# Patient Record
Sex: Male | Born: 1955 | Smoking: Former smoker
Health system: Northeastern US, Community
[De-identification: ages and names within clinical notes are randomized; demographics above are authoritative.]

## PROBLEM LIST (undated history)

## (undated) DIAGNOSIS — I639 Cerebral infarction, unspecified: Secondary | ICD-10-CM

## (undated) HISTORY — DX: Cerebral infarction, unspecified: I63.9

---

## 2018-07-18 DIAGNOSIS — I639 Cerebral infarction, unspecified: Secondary | ICD-10-CM

## 2018-07-18 HISTORY — DX: Cerebral infarction, unspecified: I63.9

## 2018-08-20 ENCOUNTER — Encounter (HOSPITAL_BASED_OUTPATIENT_CLINIC_OR_DEPARTMENT_OTHER): Payer: Self-pay | Admitting: Family Medicine

## 2018-08-20 ENCOUNTER — Ambulatory Visit (HOSPITAL_BASED_OUTPATIENT_CLINIC_OR_DEPARTMENT_OTHER): Payer: BC Managed Care – HMO | Admitting: Internal Medicine

## 2018-08-20 ENCOUNTER — Ambulatory Visit: Payer: BC Managed Care – HMO | Attending: Internal Medicine | Admitting: Family Medicine

## 2018-08-20 VITALS — BP 146/84 | HR 76 | Temp 97.7°F | Ht 68.11 in | Wt 189.9 lb

## 2018-08-20 DIAGNOSIS — R3911 Hesitancy of micturition: Secondary | ICD-10-CM | POA: Insufficient documentation

## 2018-08-20 DIAGNOSIS — R030 Elevated blood-pressure reading, without diagnosis of hypertension: Secondary | ICD-10-CM | POA: Insufficient documentation

## 2018-08-20 DIAGNOSIS — Z1211 Encounter for screening for malignant neoplasm of colon: Secondary | ICD-10-CM | POA: Insufficient documentation

## 2018-08-20 DIAGNOSIS — Z1159 Encounter for screening for other viral diseases: Secondary | ICD-10-CM | POA: Insufficient documentation

## 2018-08-20 DIAGNOSIS — Z860100 Personal history of colon polyps, unspecified: Secondary | ICD-10-CM | POA: Insufficient documentation

## 2018-08-20 DIAGNOSIS — Z7189 Other specified counseling: Secondary | ICD-10-CM | POA: Insufficient documentation

## 2018-08-20 DIAGNOSIS — Z8673 Personal history of transient ischemic attack (TIA), and cerebral infarction without residual deficits: Secondary | ICD-10-CM | POA: Insufficient documentation

## 2018-08-20 DIAGNOSIS — Q649 Congenital malformation of urinary system, unspecified: Secondary | ICD-10-CM | POA: Diagnosis present

## 2018-08-20 NOTE — Patient Instructions (Addendum)
Patient Education     DASH Eating Plan  DASH stands for "Dietary Approaches to Stop Hypertension." The DASH eating plan is a healthy eating plan that has been shown to reduce high blood pressure (hypertension). It may also reduce your risk for type 2 diabetes, heart disease, and stroke. The DASH eating plan may also help with weight loss.  What are tips for following this plan?    General guidelines   Avoid eating more than 2,300 mg (milligrams) of salt (sodium) a day. If you have hypertension, you may need to reduce your sodium intake to 1,500 mg a day.   One drink daily max    Work with your health care provider to maintain a healthy body weight or to lose weight. Ask what an ideal weight is for you.   Get at least 30 minutes of exercise that causes your heart to beat faster (aerobic exercise) most days of the week. Activities may include walking, swimming, or biking.   Work with your health care provider or diet and nutrition specialist (dietitian) to adjust your eating plan to your individual calorie needs.  Reading food labels     Check food labels for the amount of sodium per serving. Choose foods with less than 5 percent of the Daily Value of sodium. Generally, foods with less than 300 mg of sodium per serving fit into this eating plan.   To find whole grains, look for the word "whole" as the first word in the ingredient list.  Shopping   Buy products labeled as "low-sodium" or "no salt added."   Buy fresh foods. Avoid canned foods and premade or frozen meals.  Cooking   Avoid adding salt when cooking. Use salt-free seasonings or herbs instead of table salt or sea salt. Check with your health care provider or pharmacist before using salt substitutes.   Do not fry foods. Cook foods using healthy methods such as baking, boiling, grilling, and broiling instead.   Cook with heart-healthy oils, such as olive, canola, soybean, or sunflower oil.  Meal planning   Eat a balanced diet that includes:  ? 5  or more servings of fruits and vegetables each day. At each meal, try to fill half of your plate with fruits and vegetables.  ? Up to 6-8 servings of whole grains each day.  ? Less than 6 oz of lean meat, poultry, or fish each day. A 3-oz serving of meat is about the same size as a deck of cards. One egg equals 1 oz.  ? 2 servings of low-fat dairy each day.  ? A serving of nuts, seeds, or beans 5 times each week.  ? Heart-healthy fats. Healthy fats called Omega-3 fatty acids are found in foods such as flaxseeds and coldwater fish, like sardines, salmon, and mackerel.   Limit how much you eat of the following:  ? Canned or prepackaged foods.  ? Food that is high in trans fat, such as fried foods.  ? Food that is high in saturated fat, such as fatty meat.  ? Sweets, desserts, sugary drinks, and other foods with added sugar.  ? Full-fat dairy products.   Do not salt foods before eating.   Try to eat at least 2 vegetarian meals each week.   Eat more home-cooked food and less restaurant, buffet, and fast food.   When eating at a restaurant, ask that your food be prepared with less salt or no salt, if possible.  What foods are recommended?  The  items listed may not be a complete list. Talk with your dietitian about what dietary choices are best for you.  Grains - MAKE SURE TO CHECK HOW MUCH SODIUM IS IN YOUR BREAD   Whole-grain or whole-wheat bread. Whole-grain or whole-wheat pasta. Brown rice. Modena Morrow. Bulgur. Whole-grain and low-sodium cereals. Pita bread. Low-fat, low-sodium crackers. Whole-wheat flour tortillas.  Vegetables  Fresh or frozen vegetables (raw, steamed, roasted, or grilled). Low-sodium or reduced-sodium tomato and vegetable juice. Low-sodium or reduced-sodium tomato sauce and tomato paste. Low-sodium or reduced-sodium canned vegetables.  Fruits  All fresh, dried, or frozen fruit. Canned fruit in natural juice (without added sugar).  Meat and other protein foods  Skinless chicken or [place].  Ground chicken or [place]. Pork with fat trimmed off. Fish and seafood. Egg whites. Dried beans, peas, or lentils. Unsalted nuts, nut butters, and seeds. Unsalted canned beans. Lean cuts of beef with fat trimmed off. Low-sodium, lean deli meat.  Dairy  Low-fat (1%) or fat-free (skim) milk. Fat-free, low-fat, or reduced-fat cheeses. Nonfat, low-sodium ricotta or cottage cheese. Low-fat or nonfat yogurt. Low-fat, low-sodium cheese.  Fats and oils  Soft margarine without trans fats. Vegetable oil. Low-fat, reduced-fat, or light mayonnaise and salad dressings (reduced-sodium). Canola, safflower, olive, soybean, and sunflower oils. Avocado.  Seasoning and other foods  Herbs. Spices. Seasoning mixes without salt. Unsalted popcorn and pretzels. Fat-free sweets.  What foods are not recommended?  The items listed may not be a complete list. Talk with your dietitian about what dietary choices are best for you.  Grains  Baked goods made with fat, such as croissants, muffins, or some breads. Dry pasta or rice meal packs.  Vegetables  Creamed or fried vegetables. Vegetables in a cheese sauce. Regular canned vegetables (not low-sodium or reduced-sodium). Regular canned tomato sauce and paste (not low-sodium or reduced-sodium). Regular tomato and vegetable juice (not low-sodium or reduced-sodium). Angie Fava. Olives.  Fruits  Canned fruit in a light or heavy syrup. Fried fruit. Fruit in cream or butter sauce.  Meat and other protein foods  Fatty cuts of meat. Ribs. Fried meat. Berniece Salines. Sausage. Bologna and other processed lunch meats. Salami. Fatback. Hotdogs. Bratwurst. Salted nuts and seeds. Canned beans with added salt. Canned or smoked fish. Whole eggs or egg yolks. Chicken or [place] with skin.  Dairy  Whole or 2% milk, cream, and half-and-half. Whole or full-fat cream cheese. Whole-fat or sweetened yogurt. Full-fat cheese. Nondairy creamers. Whipped toppings. Processed cheese and cheese spreads.  Fats and oils  Butter. Stick  margarine. Lard. Shortening. Ghee. Bacon fat. Tropical oils, such as coconut, palm kernel, or palm oil.  Seasoning and other foods  Salted popcorn and pretzels. Onion salt, garlic salt, seasoned salt, table salt, and sea salt. Worcestershire sauce. Tartar sauce. Barbecue sauce. Teriyaki sauce. Soy sauce, including reduced-sodium. Steak sauce. Canned and packaged gravies. Fish sauce. Oyster sauce. Cocktail sauce. Horseradish that you find on the shelf. Ketchup. Mustard. Meat flavorings and tenderizers. Bouillon cubes. Hot sauce and Tabasco sauce. Premade or packaged marinades. Premade or packaged taco seasonings. Relishes. Regular salad dressings.  Where to find more information:   National Heart, Lung, and [place]: https://wilson-eaton.com/   American Heart Association: www.heart.org  Summary   The DASH eating plan is a healthy eating plan that has been shown to reduce high blood pressure (hypertension). It may also reduce your risk for type 2 diabetes, heart disease, and stroke.   With the DASH eating plan, you should limit salt (sodium) intake to 2,300 mg a  day. If you have hypertension, you may need to reduce your sodium intake to 1,500 mg a day.   When on the DASH eating plan, aim to eat more fresh fruits and vegetables, whole grains, lean proteins, low-fat dairy, and heart-healthy fats.   Work with your health care provider or diet and nutrition specialist (dietitian) to adjust your eating plan to your individual calorie needs.  This information is not intended to replace advice given to you by your health care provider. Make sure you discuss any questions you have with your health care provider.  Document Released: 05/29/2011 Document Revised: 06/02/2016 Document Reviewed: 06/02/2016  Elsevier Interactive Patient Education  2019 [employer].

## 2018-08-20 NOTE — Assessment & Plan Note (Signed)
F/u Dr. Caryn Section at [facility]

## 2018-08-20 NOTE — Assessment & Plan Note (Addendum)
F/u at next appt in 2 weeks  BMP drawn in case need to start meds

## 2018-08-20 NOTE — Assessment & Plan Note (Signed)
F/u colonoscopy

## 2018-08-20 NOTE — Progress Notes (Signed)
PRIMARY CARE NOTE - NEW PATIENT       SUBJECTIVE:  Roy Landry is a 63 year old English-speaking male who is here to establish primary care. Prior PCP retired 17yrs ago .     Presents with the following chief complaint:   Establish Care and Referral (needs to have a eeg)    Stroke a while ago which is why needs EEG done     (obtained by [facility] and reviewed)    Problem List        Cardiac and Vasculature    Elevated blood pressure reading     08/20/2018: 146/84 on recheck, concern given recent hx of stroke, pt does not want medications, gave information on the DASH diet and exercise for now            Gastrointestinal and Abdominal    History of colonic polyps     08/20/2018: pt reports were benign, had been getting biannual colonoscopies, none for past 5 years, order placed for screening colonoscopy            Neuro    History of stroke - Primary     08/20/2018: Jan 2020, started aspirin and statin, was told needed f/u EEG, stopped smoking in 2018, referral placed to Dr. Caryn Section at [facility] per pt request              Month ago felt sx of stroke,     Denies: Fevers, chills, nausea, vomiting, diarrhea, chest pain, shortness of breath, skin changes, dizziness, eye redness, edema  All other systems were reviewed and are negative.      Medication list reviewed and updated in EpicCare.     Past Medical History:   has a past medical history of Stroke (cerebrum) ([place]) (07/18/2018).    No past surgical history on file.  Review of patient's family history indicates:  Problem: Diabetes      Relation: Mother          Age of Onset: (Not Specified)  Problem: Diabetes      Relation: Sister          Age of Onset: (Not Specified)    Social History    Tobacco Use      Smoking status: Former Smoker      Smokeless tobacco: Never Used      Tobacco comment: quit in 2018    Alcohol use: Yes      Comment: social drinker, >5 drinks 1-2x years, drinks one beer daily     Drug use: Never    Social History    Social History Narrative       08/20/2018: from the [place] originally, [place], two kids, works in [employer], likes to exercise stationary bike/weights, motivated not to take medications       Review of Patient's Allergies indicates:  No Known Allergies      OBJECTIVE:  BP (!) 150/82  Pulse 76  Temp 97.7 F (36.5 C) (Temporal)  Ht 5' 8.11" (1.73 m)  Wt 86.1 kg (189 lb 14.4 oz)  SpO2 97%  BMI 28.78 kg/m2  Physical Exam   Constitutional: He appears well-developed and well-nourished. No distress.   HENT:   Head: Normocephalic and atraumatic.   Eyes: Conjunctivae are normal.   Pulmonary/Chest: Effort normal. No stridor.   Musculoskeletal: Normal range of motion.   Neurological: Coordination normal.   Skin: Skin is warm and dry. He is not diaphoretic.   Psychiatric: He has  a normal mood and affect.         No relevant labs, imaging, and studies in EpicCare. Pt brought paperwork from [facility] stating needed EEG but no relavent results       ASSESSMENT AND PLAN:  Roy Landry is a 63 year old male who presents with:     Problem List Items Addressed This Visit        Cardiac and Vasculature    Elevated blood pressure reading     F/u at next appt in 2 weeks  BMP drawn in case need to start meds             Gastrointestinal and Abdominal    History of colonic polyps     F/u colonoscopy            Neuro    History of stroke - Primary     F/u Dr. Caryn Section at [facility] (EXT) (Completed)    LIPID PANEL    HEMOGLOBIN O1H    BASIC METABOLIC PANEL      Other Visit Diagnoses     Urinary anomaly        Relevant Orders    PROSTATIC SPECIFIC ANTIGEN    Need for hepatitis C screening test        Relevant Orders    HEPATITIS C PCR QUAL TO QUANT    Advance care planning        Relevant Orders    HEALTH CARE PROXY (Completed)    Screening for malignant neoplasm of colon        Relevant Orders    COLONOSCOPY          **Will obtain medical records from prior PCP office for relevant past medical  history and immunizations.    **Reviewed services provided at this clinic and [facility], including clinic care team, specialty referrals, and after hours access.     Follow up in approximately 2-4weeks     ____________    Demetrius Revel, MD   Pager: [phone]  Office phone: [phone]  Email: [email or web address]  [facility]

## 2018-08-20 NOTE — Assessment & Plan Note (Signed)
F/u PSA and IPSS at f/u appt

## 2018-08-23 ENCOUNTER — Telehealth (HOSPITAL_BASED_OUTPATIENT_CLINIC_OR_DEPARTMENT_OTHER): Payer: Self-pay

## 2018-08-23 ENCOUNTER — Encounter (HOSPITAL_BASED_OUTPATIENT_CLINIC_OR_DEPARTMENT_OTHER): Payer: Self-pay | Admitting: Physician Assistant

## 2018-08-23 DIAGNOSIS — R7303 Prediabetes: Secondary | ICD-10-CM | POA: Insufficient documentation

## 2018-08-23 LAB — BASIC METABOLIC PANEL
ANION GAP: 4 mmol/L — ABNORMAL LOW (ref 5–15)
BUN (UREA NITROGEN): 14 mg/dL (ref 7–18)
CALCIUM: 9.3 mg/dL (ref 8.5–10.1)
CARBON DIOXIDE: 29 mmol/L (ref 21–32)
CHLORIDE: 110 mmol/L — ABNORMAL HIGH (ref 98–107)
CREATININE: 0.8 mg/dL (ref 0.7–1.2)
ESTIMATED GLOMERULAR FILT RATE: >60 mL/min (ref >60)
Glucose Random: 114 mg/dL (ref 74–160)
POTASSIUM: 4.3 mmol/L (ref 3.5–5.1)
SODIUM: 143 mmol/L (ref 136–145)

## 2018-08-23 LAB — LIPID PANEL
Cholesterol: 130 mg/dL (ref 0–239)
HIGH DENSITY LIPOPROTEIN: 45 mg/dL (ref 40–?)
LOW DENSITY LIPOPROTEIN DIRECT: 71 mg/dL (ref 0–189)
TRIGLYCERIDES: 78 mg/dL (ref 0–150)

## 2018-08-23 LAB — HEPATITIS C PCR QUAL TO QUANT: HEPATITIS C ANTIBODY: NONREACTIVE

## 2018-08-23 LAB — HEMOGLOBIN A1C
ESTIMATED AVERAGE GLUCOSE: 117 (ref 74–160)
HEMOGLOBIN A1C: 5.7 % — ABNORMAL HIGH (ref 4.0–5.6)

## 2018-08-23 LAB — PROSTATIC SPECIFIC ANTIGEN: PROSTATIC SPECIFIC ANTIGEN: 0.737 ng/mL (ref 0.000–4.000)

## 2018-08-23 NOTE — Progress Notes (Signed)
LVM for pt to return call

## 2018-08-23 NOTE — Progress Notes (Signed)
Joaquin Bend, PA-C  P Cfh/Cfn Rn Triage            Dear RN,     Please:    1. Create Telephone encounter for this patient.   2. Share with the patient the attached results. Patient pre-DM (but barely), please discuss healthy eating/exercise. All other labs normal.     Plan:   1. No further tests need to be done. .     2. Type of Outreach: 1 phone call and if unable to reach send letter     3. Document the conversation in the Telephone Encounter and close the encounter, no need to send back to me.     Thank you,   Joaquin Bend, PA-C

## 2018-08-23 NOTE — Progress Notes (Signed)
Dear RN,    Please:    1. Create Telephone encounter for this patient.  2. Share with the patient the attached results. Patient pre-DM (but barely), please discuss healthy eating/exercise. All other labs normal.    Plan:  1. No further tests need to be done. .    2. Type of Outreach: 1 phone call and if unable to reach send letter    3. Document the conversation in the Telephone Encounter and close the encounter, no need to send back to me.     Thank you,  Joaquin Bend, PA-C

## 2018-08-24 NOTE — Progress Notes (Signed)
LVM for pt to return call

## 2018-08-25 ENCOUNTER — Encounter (HOSPITAL_BASED_OUTPATIENT_CLINIC_OR_DEPARTMENT_OTHER): Payer: Self-pay

## 2018-08-25 ENCOUNTER — Other Ambulatory Visit (HOSPITAL_BASED_OUTPATIENT_CLINIC_OR_DEPARTMENT_OTHER): Payer: Self-pay

## 2018-08-25 MED ORDER — PEG 3350-KCL-NA BICARB-NACL 420 G PO SOLR
4000.00 mL | Freq: Once | ORAL | 0 refills | Status: AC
Start: 2018-08-25 — End: 2018-08-25

## 2018-08-25 MED ORDER — PEG 3350-KCL-NA BICARB-NACL 420 G PO SOLR: 4000 mL | mL | Freq: Once | ORAL | 0 refills | 0 days | Status: AC

## 2018-08-25 NOTE — Progress Notes (Signed)
Spoke with patient regarding scheduling screening colonoscopy . Procedure/preparation/need for ride explained. Assessment completed. Patient scheduled on 09/27/18 arrive [facility] at 630am with Dr Mamie Levers with MAC for daily etoh  Colonoscopy Standard       Division of Gastroenterology  [facility]  Phone:__________________  [facility]  [place], [place] [address]  PLEASE READ CAREFULLY OR HAVE SOMEONE READ THIS TO YOU!      ____________________________________________  Name    Your Colonoscopy test is scheduled at _____________ Hospital    Day_______________     Time to Arrive _______________    Register ______________________            Dennis Bast are having a colonoscopy.  This test lets the doctor see the inside your large intestine (also called your colon) using a small camera on a flexible tube.  There are several reasons to have the test:   *Screening for colon cancers and polyps    *To check unexplained changes in bowel habits   *To evaluate abnormal X-ray findings   *To look for bleeding ulcers or other abnormalities of the colon lining                 HOW TO GET READY FOR THE TEST      Your prescription was sent to your pharmacy or given to you.    Please pick up your prescription within 1 week of receiving these instructions.     IF YOU ARE USING NULYTELY OR A DRUG LIKE IT, DO NOT ADD WATER TO THE PRESCRIPTION UNTIL 1 DAY BEFORE THE DAY OF YOUR EXAM.    Your prescription was sent to: _______________________________________    Call a friend or a family member to make sure you have   someone to take you home after your test.   You must have someone to go home with after the test or we will not be able to give you sedatives!      If you have any questions or worries, or if you are unsure about how to prepare for this test, please call _____________                                  DAY OF THE TEST    4 to 5 hours before your test, drink the remaining half of the bottle of laxative.    It is  very important to finish the WHOLE [place].    The morning of the test, you can take your other medications at the usual time with a sip of water. These include blood pressure pills, seizure medications, heart medications, thyroid medications, etc).     Don't take pills for diabetes. Bring these medicines with you so that you can take them right after your test.      NOTHING to drink for 3 hours before the test.      IMPORTANT INFORMATION About Your Medicines  Based On Recommendations From Experts    IF YOU HAVE ANY CONCERNS ABOUT YOUR MEDICATIONS [place]. DO NOT WAIT UNTIL THE DAY BEFORE THE TEST!!!!!      Your Medications  You can take your medications for high blood pressure, heart problems, or anxiety with a sip of water at your usual time.  Bring a list of your medications with you.     If you take medicines for Type 2 Diabetes:    One day before  the test: If you are taking diabetes pills: Take PILLS in the morning only. If you take morning insulin: take your usual dose.    On the night before the test: Do not take diabetes pills.  If you take insulin for type 2 diabetes, take  your usual long acting insulin.  For example: if you usually take 40 units of Lantus or NPH, take 20 units instead.    On the morning of the test: Do not take diabetes pills. If you are on long-acting insulin for type 2 diabetes, you can take half the dose. For example if you are on 40 units of Lantus or NPH, take 20 units instead.     After the test: You will be allowed to eat normally again. At that time, you should resume taking your diabetes pills at your usual times. If on insulin, take your usual evening dose of insulin. If you can't eat for any reason, check with your doctor.    If you have Type 1 Diabetes:    One day before the test: Take your usual long-acting basal insulin (Lantus or NPH) and make sure your clear liquid diets contain sugar. Check your blood sugars at meal times and cover with short acting insulin  only if blood sugar is over 200. Otherwise do not take your short-acting insulin.    On the night before the test: Just take your usual basal insulin dose that you would normally take at that time (for example, your usual full dose of Lantus or NPH).    On the morning of the test: Take your usual basal insulin dose that you would normally take at that time (for example, your usual full dose of Lantus or NPH).                            Test Checklist    Please use this list to prepare for your test.     7 days before the test (____/____/____)     STOP taking Motrin, Advil, Naprosyn, Aleve or related drugs.   Continue to take all other prescribed meds.   Ask your doctor about whether you should continue Aspirin. It is sometimes        important to stay on this medication.    3 days before the test (____/____/____)     STOP eating any foods that contain beans, seeds, skins, nuts, or are high in fiber.    2 days before the test (____/____/____)     MAKE SURE YOU HAVE A RIDE ARRANGED    EAT dinner no later than 7 pm.  BEGIN a liquid diet. (Do not eat solids. This includes foods such as rice, pasta, bread and fruit).    1 day before the test (____/____/____)     PREPARE the laxative.   START taking the laxative at 6:00pm.   DRINK  the bottle of the laxative.    Day of your test (____/____/____)     FINISH  the rest of the laxative 5 to 6 hours before your test.     DO NOT drink anything 3 hours before the test.      WHAT TO EXPECT ON THE DAY OF YOUR TEST    Colonoscopy  Colonoscopy is a procedure used to see inside the colon and rectum.  During a colonoscopy a flexible tube with a camera and a light is inserted through the rectum. The doctor then examines the large  bowel (also called the large intestine or colon).    Colonoscopies can detect inflamed tissue, ulcers and abnormal growths called polyps. Some polyps are cancerous, but most are "pre cancerous". These might become dangerous someday. A doctor can usually  remove these polyps during the test. They are then sent to a laboratory to be checked. Polyps are common in adults and are generally harmless. However, most colorectal cancers begin as polyps. Removing them is a good way to prevent cancer. Your doctor may also take biopsies (small pieces of tissue) for analysis in the lab of abnormalities that they want to check in more detail.    It is extremely important to follow all of the steps for cleaning out your colon. If you do not follow all the steps, the doctor may not be able to see clearly. The exam might need to be cancelled and repeated another day. The clear liquids you can take during the clean out are treated as food by your body, so you won't starve or get dehydrated.        Getting Ready At [facility]  On the day of your test, a nurse and doctor will ask you some more information about your health history.  You will be put into a hospital gown. A small intravenous needle will be inserted in the back of your hand or forearm to give you medications that will make you comfortable during the test.    In the procedure room, you will be asked to lie down in a curled position on your left side.  Please inform the doctor if this is uncomfortable for you. You will be given oxygen to breathe. The test usually lasts 30 minutes to an hour. It may last longer if polyps need to be removed or if other abnormalities are noted.      You will be given medication through the IV in order to control discomfort and help you relax.  You may sleep or be partially awake during the test. Sometimes, you might feel a cramping sensation. We will monitor you and try to make you as comfortable as possible. The tube will be inserted into your rectum (back side) and advanced through the large bowel.  The doctor will try and look at all of the inner walls of your colon. The outer wall and organs outside the colon are not visible with this test.    Possible Complications  Complications are  unusual during or after the test but they can happen. The most common risks include colonic perforation (a tear in the colon), bleeding, respiratory problems, blood pressure problems, heart problems, discomfort and adverse reactions to the medications used.  A perforation may result in the need for emergency surgery and a colostomy bag. Also note, colonoscopy like other medical tests is not perfect. It may not detect problems such as polyps, cancers and other diseases up to 2 to 6% of the time. Luckily, the combined risk of all of these problems is small.    After the Test  You may feel bloated from air which was put into your colon during the test. You may also feel a little drowsy from the medications. You cannot drive or operate heavy machinery or do any important work for the rest of the day. You should plan on resting, watching TV or reading light material after the test. You may forget things that happen during and directly after the test. It is important have someone with you that can remind you  of any instructions we give.    Depending on what is found, you may need to have the colonoscopy repeated. Usually, this is done several years later but it may need to be done much sooner. Talk to your doctor about when you should have repeat study or other testing.    You will usually be at the hospital between 2-3 hours (although sometimes it can take longer).  We will make sure that you are alright before sending you home. You must arrange for someone to drive you home after the test.  Once again, we will not perform the test unless you have an arranged ride. You cannot go home in a taxi or a bus.    Colonoscopy is a safe and effective test that is commonly done at our facilities. You may receive a call to remind you of the date and time of your test. If you have any questions, please feel free to call.     For questions about the test itself, call [phone].    For questions about the date and time of your  test, or to change the date or time, call [phone]    For questions regarding your regular medications or health issues, please call your doctor.

## 2018-08-25 NOTE — Progress Notes (Signed)
LVM for pt. to return call x3, letter sent.

## 2018-09-09 ENCOUNTER — Other Ambulatory Visit (HOSPITAL_BASED_OUTPATIENT_CLINIC_OR_DEPARTMENT_OTHER): Payer: Self-pay | Admitting: Internal Medicine

## 2018-09-09 NOTE — Progress Notes (Signed)
PER Patient (self),@ is a 63 year old male has requested a refill of Atorvastatin.      - Please review, Atorvastatin has been discontinued/marked Historical on 08/20/2018.        Last Office Visit: 08/20/2018 with Rande Brunt  Last Physical Exam: NA       Statin Med:  Lipids   Cholesterol (mg/dL)   Date Value   08/20/2018 130     LOW DENSITY LIPOPROTEIN DIRECT (mg/dL)   Date Value   08/20/2018 71     HIGH DENSITY LIPOPROTEIN (mg/dL)   Date Value   08/20/2018 45     TRIGLYCERIDES (mg/dL)   Date Value   08/20/2018 78   LFTs   No results found for: ALT    No results found for: AST    No results found for: ALBUMIN    No results found for: TP    No results found for: DBILI    No results found for: TBILI    No results found for: [facility]       Documented patient preferred pharmacies:    [id number] [place], [place] - [place]  Phone: [phone] Fax: [phone]

## 2018-09-10 MED ORDER — ATORVASTATIN CALCIUM 80 MG PO TABS
80.0000 mg | ORAL_TABLET | Freq: Every day | ORAL | 3 refills | Status: DC
Start: 2018-09-10 — End: 2018-09-14

## 2018-09-10 MED ORDER — ATORVASTATIN CALCIUM 80 MG PO TABS: 80 mg | tablet | Freq: Every day | ORAL | 3 refills | 0 days | Status: AC

## 2018-09-14 ENCOUNTER — Ambulatory Visit: Payer: BC Managed Care – HMO | Attending: Internal Medicine | Admitting: Family Medicine

## 2018-09-14 ENCOUNTER — Other Ambulatory Visit: Payer: Self-pay

## 2018-09-14 DIAGNOSIS — Z8673 Personal history of transient ischemic attack (TIA), and cerebral infarction without residual deficits: Secondary | ICD-10-CM | POA: Insufficient documentation

## 2018-09-14 DIAGNOSIS — R7303 Prediabetes: Secondary | ICD-10-CM | POA: Diagnosis not present

## 2018-09-14 MED ORDER — ASPIRIN LOW DOSE 81 MG PO TBEC: 81 mg | tablet | Freq: Every day | ORAL | 3 refills | 0 days | Status: AC

## 2018-09-14 MED ORDER — ASPIRIN LOW DOSE 81 MG PO TBEC
81.0000 mg | DELAYED_RELEASE_TABLET | Freq: Every day | ORAL | 3 refills | Status: AC
Start: 2018-09-14 — End: 2019-09-14

## 2018-09-14 MED ORDER — ATORVASTATIN CALCIUM 80 MG PO TABS: 80 mg | tablet | Freq: Every day | ORAL | 3 refills | 0 days | Status: AC

## 2018-09-14 MED ORDER — ATORVASTATIN CALCIUM 80 MG PO TABS
80.0000 mg | ORAL_TABLET | Freq: Every day | ORAL | 3 refills | Status: DC
Start: 2018-09-14 — End: 2019-10-07

## 2018-09-14 NOTE — Progress Notes (Signed)
Roy Landry is a 63 year old male here for f/u stroke and pre-diabetes     Problem List        Endocrine and Metabolic    Prediabetes     09/14/2018: advised exercise and continued dietary changes for pre-diabetes, pt agreeable and has made many changes already with his diet   08/23/2018: Hgb A1c 5.7%. RN will call to discuss healthy eating/exercise. Recheck in 1-2 years.            Neuro    History of stroke     09/14/2018: has neurology appoitment on 4/24 at [facility]   08/20/2018: Jan 2020, started aspirin and statin, was told needed f/u EEG, stopped smoking in 2018, referral placed to Dr. Caryn Section at [facility] per pt request                Patient Active Problem List:     History of stroke     Elevated blood pressure reading     History of colonic polyps     Urinary hesitancy     Prediabetes      Current Outpatient Medications:  atorvastatin (LIPITOR) 80 MG tablet Take 1 tablet by mouth daily Disp: 90 tablet Rfl: 3   ASPIRIN LOW DOSE 81 MG EC tablet  Disp:  Rfl:      No current facility-administered medications for this visit.   Review of Patient's Allergies indicates:  No Known Allergies    I have reviewed the past medical, social, and family history. All pertinent historical factors are mentioned in the HPI. The remainder are non-contributory.     Objective:     Pt breathing comfortably and speaking in full sentences without issue   Affect wnl, thought content normal, speech non-pressured       ASSESSMENT&PLAN:    Problem List Items Addressed This Visit        Endocrine and Metabolic    Prediabetes     Recheck A1c in 1 yr             Neuro    History of stroke     Continue statin and aspirin until seen by neuro                We discussed new and current medications and the importance of medication compliance. The patient was ready to learn and no apparent learning barriers were identified. I explained the diagnosis and treatment plan, and the patient expressed understanding of the content. Possible side  effects of the prescribed medication(s) were explained.  I attempted to answer any questions regarding the diagnosis and the proposed treatment.        This patient was identified as meeting criteria for a televisit rather than an in person visit due to public health concerns around COVID-19. A complete assessment and plan is detailed in the note, all of which were conducted remotely using telephone technology.  Patient identity was verbally confirmed by the patient/guardian with 2 identifiers (name, date of birth, and/or address) at the beginning of the visit  Patient/guardian verbally consented to care by televisit as appropriate.   Patient/guardian was located home during the visit and confirmed that they understood they were encouraged to be in private location due to personal health information being discussed.  Patient/guardian was informed how to access face-to-face care in the event of an emergency.  Provider was located in a [facility] Ambulatory exam room during the visit.  If this is a new patient visit, all  available records and medical history were reviewed by the provider.  Visit length was 15 minutes and counseling was done on the diagnoses indicated in the visit.        Signed by   Demetrius Revel, MD   Pager: [phone]  Office phone: [phone]  Email: [email or web address]  [facility]

## 2018-09-14 NOTE — Assessment & Plan Note (Signed)
Continue statin and aspirin until seen by neuro

## 2018-09-14 NOTE — Assessment & Plan Note (Signed)
Recheck A1c in 1 yr

## 2018-09-27 ENCOUNTER — Other Ambulatory Visit (HOSPITAL_BASED_OUTPATIENT_CLINIC_OR_DEPARTMENT_OTHER): Payer: Self-pay

## 2018-10-11 DIAGNOSIS — I517 Cardiomegaly: Secondary | ICD-10-CM | POA: Diagnosis not present

## 2018-10-11 DIAGNOSIS — I10 Essential (primary) hypertension: Secondary | ICD-10-CM | POA: Diagnosis not present

## 2018-10-11 DIAGNOSIS — R209 Unspecified disturbances of skin sensation: Secondary | ICD-10-CM | POA: Diagnosis not present

## 2018-10-11 DIAGNOSIS — E785 Hyperlipidemia, unspecified: Secondary | ICD-10-CM | POA: Diagnosis not present

## 2018-10-11 DIAGNOSIS — I6330 Cerebral infarction due to thrombosis of unspecified cerebral artery: Secondary | ICD-10-CM | POA: Diagnosis not present

## 2018-10-20 DIAGNOSIS — I517 Cardiomegaly: Secondary | ICD-10-CM | POA: Diagnosis not present

## 2018-10-20 DIAGNOSIS — E785 Hyperlipidemia, unspecified: Secondary | ICD-10-CM | POA: Diagnosis not present

## 2018-10-20 DIAGNOSIS — I6330 Cerebral infarction due to thrombosis of unspecified cerebral artery: Secondary | ICD-10-CM | POA: Diagnosis not present

## 2018-10-21 DIAGNOSIS — I6330 Cerebral infarction due to thrombosis of unspecified cerebral artery: Secondary | ICD-10-CM | POA: Diagnosis not present

## 2018-10-21 DIAGNOSIS — I10 Essential (primary) hypertension: Secondary | ICD-10-CM | POA: Diagnosis not present

## 2018-10-21 DIAGNOSIS — I480 Paroxysmal atrial fibrillation: Secondary | ICD-10-CM | POA: Diagnosis not present

## 2018-10-21 DIAGNOSIS — I493 Ventricular premature depolarization: Secondary | ICD-10-CM | POA: Diagnosis not present

## 2018-11-30 DIAGNOSIS — R400 Somnolence: Secondary | ICD-10-CM | POA: Diagnosis not present

## 2018-11-30 DIAGNOSIS — I6350 Cerebral infarction due to unspecified occlusion or stenosis of unspecified cerebral artery: Secondary | ICD-10-CM | POA: Diagnosis not present

## 2018-11-30 DIAGNOSIS — G40209 Localization-related (focal) (partial) symptomatic epilepsy and epileptic syndromes with complex partial seizures, not intractable, without status epilepticus: Secondary | ICD-10-CM | POA: Diagnosis not present

## 2018-12-03 ENCOUNTER — Telehealth (HOSPITAL_BASED_OUTPATIENT_CLINIC_OR_DEPARTMENT_OTHER): Payer: BC Managed Care – HMO

## 2018-12-03 ENCOUNTER — Encounter (HOSPITAL_BASED_OUTPATIENT_CLINIC_OR_DEPARTMENT_OTHER): Payer: Self-pay

## 2018-12-03 NOTE — Progress Notes (Signed)
Left vm

## 2019-04-13 ENCOUNTER — Other Ambulatory Visit (HOSPITAL_BASED_OUTPATIENT_CLINIC_OR_DEPARTMENT_OTHER): Payer: Self-pay | Admitting: Family Medicine

## 2019-04-13 DIAGNOSIS — I10 Essential (primary) hypertension: Secondary | ICD-10-CM | POA: Diagnosis not present

## 2019-04-13 DIAGNOSIS — E785 Hyperlipidemia, unspecified: Secondary | ICD-10-CM | POA: Diagnosis not present

## 2019-04-13 DIAGNOSIS — Z8673 Personal history of transient ischemic attack (TIA), and cerebral infarction without residual deficits: Secondary | ICD-10-CM

## 2019-04-13 DIAGNOSIS — R209 Unspecified disturbances of skin sensation: Secondary | ICD-10-CM | POA: Diagnosis not present

## 2019-04-13 DIAGNOSIS — I517 Cardiomegaly: Secondary | ICD-10-CM | POA: Diagnosis not present

## 2019-05-03 ENCOUNTER — Telehealth (HOSPITAL_BASED_OUTPATIENT_CLINIC_OR_DEPARTMENT_OTHER): Payer: Self-pay | Admitting: Family Medicine

## 2019-05-03 ENCOUNTER — Ambulatory Visit (HOSPITAL_BASED_OUTPATIENT_CLINIC_OR_DEPARTMENT_OTHER): Payer: Commercial Managed Care - HMO | Admitting: Family Medicine

## 2019-05-03 NOTE — Telephone Encounter (Signed)
Phoned pt x3 related to his appt for "meds"   No answer, LVM advising him to call back the clinic and reschedule

## 2019-05-09 ENCOUNTER — Telehealth (HOSPITAL_BASED_OUTPATIENT_CLINIC_OR_DEPARTMENT_OTHER): Payer: Self-pay | Admitting: Family Medicine

## 2019-05-09 DIAGNOSIS — Z1159 Encounter for screening for other viral diseases: Secondary | ICD-10-CM

## 2019-05-09 NOTE — Telephone Encounter (Signed)
Pt needs orders for 11.20.2020  Thank you

## 2019-05-13 ENCOUNTER — Ambulatory Visit
Admission: RE | Admit: 2019-05-13 | Discharge: 2019-05-13 | Disposition: A | Discharge status: Home / Self Care | Payer: Commercial Managed Care - HMO | Attending: Family Medicine | Admitting: Family Medicine

## 2019-05-13 DIAGNOSIS — Z20828 Contact with and (suspected) exposure to other viral communicable diseases: Secondary | ICD-10-CM | POA: Diagnosis present

## 2019-05-13 DIAGNOSIS — Z1159 Encounter for screening for other viral diseases: Secondary | ICD-10-CM

## 2019-05-14 LAB — COVID-19 OUTPATIENT: COVID-19 OUTPATIENT: NEGATIVE

## 2019-05-16 ENCOUNTER — Telehealth (HOSPITAL_BASED_OUTPATIENT_CLINIC_OR_DEPARTMENT_OTHER): Payer: Self-pay | Admitting: Family Medicine

## 2019-05-16 NOTE — Telephone Encounter (Signed)
No answer  Letter mailed

## 2019-09-26 ENCOUNTER — Other Ambulatory Visit (HOSPITAL_BASED_OUTPATIENT_CLINIC_OR_DEPARTMENT_OTHER): Payer: Self-pay | Admitting: Family Medicine

## 2019-09-27 NOTE — Telephone Encounter (Signed)
PER Pharmacy, Roy Landry is a 64 year old male has requested a refill of aspirin.      Last Office Visit: 09/14/2018 with Rande Brunt  Last Physical Exam: n/a    FECAL OCCULT BLOOD AGE 71+ Completed    Other Med Adult:  Most Recent BP Reading(s)  08/20/18 : (!) 146/84        Cholesterol (mg/dL)   Date Value   08/20/2018 130     LOW DENSITY LIPOPROTEIN DIRECT (mg/dL)   Date Value   08/20/2018 71     HIGH DENSITY LIPOPROTEIN (mg/dL)   Date Value   08/20/2018 45     TRIGLYCERIDES (mg/dL)   Date Value   08/20/2018 78         No results found for: TSHSC      No results found for: TSH    HEMOGLOBIN A1C (%)   Date Value   08/20/2018 5.7 (H)       No results found for: POCA1C      No results found for: INR    SODIUM (mmol/L)   Date Value   08/20/2018 143       POTASSIUM (mmol/L)   Date Value   08/20/2018 4.3           CREATININE (mg/dL)   Date Value   08/20/2018 0.8       Documented patient preferred pharmacies:    [id number] [place], [place] - [place]  Phone: [phone] Fax: [phone]

## 2019-09-28 ENCOUNTER — Telehealth (HOSPITAL_BASED_OUTPATIENT_CLINIC_OR_DEPARTMENT_OTHER): Payer: Self-pay

## 2019-09-28 NOTE — Telephone Encounter (Signed)
Unable to reach pt left a detailed v/m to call back and book per pcp review note below    Refill Request    [place], PA-C  [place]; Gustavus Messing, PharmD 17 hours ago (4:01 PM)     Hi PMRs! 90 day Rx sent. Pt not seen in >1 year. Needs televisit or in person f/u w/ team before Rx runs out. Thanks! BR    Routing comment

## 2019-10-07 ENCOUNTER — Ambulatory Visit: Payer: Commercial Managed Care - HMO | Attending: Family Medicine | Admitting: Family Medicine

## 2019-10-07 ENCOUNTER — Encounter (HOSPITAL_BASED_OUTPATIENT_CLINIC_OR_DEPARTMENT_OTHER): Payer: Self-pay

## 2019-10-07 DIAGNOSIS — Z8673 Personal history of transient ischemic attack (TIA), and cerebral infarction without residual deficits: Secondary | ICD-10-CM

## 2019-10-07 DIAGNOSIS — Z86010 Personal history of colonic polyps: Secondary | ICD-10-CM | POA: Diagnosis present

## 2019-10-07 DIAGNOSIS — R7303 Prediabetes: Secondary | ICD-10-CM | POA: Insufficient documentation

## 2019-10-07 DIAGNOSIS — R030 Elevated blood-pressure reading, without diagnosis of hypertension: Secondary | ICD-10-CM | POA: Diagnosis not present

## 2019-10-07 MED ORDER — ATORVASTATIN CALCIUM 80 MG PO TABS
80.0000 mg | ORAL_TABLET | Freq: Every day | ORAL | 3 refills | Status: DC
Start: 2019-10-07 — End: 2020-05-21

## 2019-10-07 NOTE — Progress Notes (Signed)
Roy Landry is a 64 year old male here for routine wellness     NO acute complaints     Problem List        Cardiac and Vasculature    Elevated blood pressure reading     10/07/2019: does physical work but no additional exercise given his gym has been closed, planning to restart that, no BP cuff at home  08/20/2018: 146/84 on recheck, concern given recent hx of stroke, pt does not want medications, gave information on the DASH diet and exercise for now            Endocrine and Metabolic    Prediabetes     10/07/2019: has gained 5lbs over the past year, has had decreased activity in the pandemic   09/14/2018: advised exercise and continued dietary changes for pre-diabetes, pt agreeable and has made many changes already with his diet   08/23/2018: Hgb A1c 5.7%. RN will call to discuss healthy eating/exercise. Recheck in 1-2 years.            Gastrointestinal and Abdominal    History of colonic polyps     08/20/2018: pt reports were benign, had been getting biannual colonoscopies, none for past 5 years, order placed for screening colonoscopy            Neuro    History of stroke - Primary     10/07/2019: was discharged from neurology as told that everything stabilized   09/14/2018: has neurology appoitment on 4/24 at [facility]   08/20/2018: Jan 2020, started aspirin and statin, was told needed f/u EEG, stopped smoking in 2018, referral placed to Dr. Caryn Section at [facility] per pt request                    Patient Active Problem List:     History of stroke     Elevated blood pressure reading     History of colonic polyps     Urinary hesitancy     Prediabetes    ASPIRIN LOW DOSE 81 MG EC tablet, TAKE 1 TABLET BY MOUTH EVERY DAY, Disp: 90 tablet, Rfl: 0  atorvastatin (LIPITOR) 80 MG tablet, Take 1 tablet by mouth daily, Disp: 90 tablet, Rfl: 3    No current facility-administered medications for this visit.    Review of Patient's Allergies indicates:  No Known Allergies      I have reviewed the relevant past medical, social,  and family history. All pertinent historical factors are mentioned in the HPI. The remainder are non-contributory.     Objective:  There were no vitals taken for this visit. as visit preformed over the phone   Pt breathing comfortably and speaking in full sentences without issue   Affect wnl, thought content normal, speech non-pressured       ASSESSMENT&PLAN:      Problem List Items Addressed This Visit        Cardiac and Vasculature    Elevated blood pressure reading     Bring in for BP check at CPE visit             Endocrine and Metabolic    Prediabetes       Gastrointestinal and Abdominal    History of colonic polyps     Refer for colonscopy             Neuro    History of stroke - Primary    Relevant Orders    HEMOGLOBIN A1C  I spent a total of 32 minutes on this visit on the date of service (total time includes all activities performed on the date of service)        Signed by   Demetrius Revel, MD   Pager: [phone]  Office phone: [phone]  Email: [email or web address]  [facility]

## 2019-10-07 NOTE — Assessment & Plan Note (Signed)
Bring in for BP check at CPE visit

## 2019-10-07 NOTE — Assessment & Plan Note (Signed)
Refer for colonscopy ?

## 2019-10-07 NOTE — Progress Notes (Signed)
COVID VACCINE REQUEST    Incoming [facility] COVID Vaccine Pool to help patient set up for COVID Vaccine. Patient did not answer. A message was left for patient to contact [facility].    Jonetta Speak, 10/07/2019

## 2019-10-11 ENCOUNTER — Telehealth (HOSPITAL_BASED_OUTPATIENT_CLINIC_OR_DEPARTMENT_OTHER): Payer: Self-pay

## 2019-10-11 ENCOUNTER — Encounter (HOSPITAL_BASED_OUTPATIENT_CLINIC_OR_DEPARTMENT_OTHER): Payer: Self-pay

## 2019-10-11 NOTE — Telephone Encounter (Signed)
error 

## 2019-10-17 ENCOUNTER — Other Ambulatory Visit (HOSPITAL_BASED_OUTPATIENT_CLINIC_OR_DEPARTMENT_OTHER): Payer: Self-pay

## 2019-10-17 ENCOUNTER — Other Ambulatory Visit (HOSPITAL_BASED_OUTPATIENT_CLINIC_OR_DEPARTMENT_OTHER): Payer: Self-pay | Admitting: Physician Assistant

## 2019-10-17 DIAGNOSIS — Z01812 Encounter for preprocedural laboratory examination: Secondary | ICD-10-CM

## 2019-10-17 NOTE — Telephone Encounter (Signed)
Spoke with patient to schedule a colonoscopy.  Procedure/preparation and need for ride explained.  Medical history reviewed.  Patient scheduled on 8/06.  Miralax/Dulcolax prep reviewed and sent.  Patient aware that [facility] requires a COVID test prior to the procedure and that someone will reach out to help coordinate.    Colonoscopy Instructions  Miralax and Dulcolax Preparation    WHAT TO BUY  • 4 Dulcolax (or generic bisacodyl laxative) tablets  • 1 large bottle of Miralax (8.3 ounces or 238 grams)  • 64 ounces of Gatorade (no red or purple colors)  7 DAYS BEFORE THE TEST        • DO NOT TAKE: Nonsteroidal anti-inflammatory medications, these include Ibuprofen, Motrin, Advil, Naproxen, Aleve, Naprosyn, Meloxicam, and many others  • You may take Tylenol (which is acetaminophen) for pain  • Some medical conditions require you to stay on Aspirin. Check with your primary care provider. Do not stop Aspirin until you speak with your health care provider.   3 DAYS BEFORE THE TEST      Stop eating high fiber foods such as corn, beans, seeds, nuts, whole grain breads, or fruit skins (pear, apple, etc.)    2 DAYS BEFORE THE TEST  • Have a light dinner no later than 7 PM    1 DAY BEFORE THE TEST  • Begin a clear liquid diet - a clear liquid means you can see through it    Clear Liquids Not Clear Liquids   Water, Gatorade, Powerade, or Pedialyte No red or purple items   Black coffee or tea (no milk, cream) No alcohol   Clear broth No milk, cream, other dairy products   Ginger ale or Sprite No noodles, rice, or vegetables in soup   Apple juice No juice with pulp   Jell-o, popsicles  No liquid you cannot see through     • No solid food for the entire day  • In the morning: take 2 dulcolax tablets  • Prepare the laxative: mix 64 oz of Gatorade with 8.3 oz of Miralax, then put it in the refrigerator  • Starting at 4 PM, drink one glass (8 ounces) of laxative every 30 minutes until finished. Be sure to stay close to a bathroom once  you start the prep.  • At 9 PM, take 2 gas pills (simethicone) with 1 glass (8 ounces) of clear liquid  • At 10 PM, take 2 more gas pills (simethicone) with 1 glass (8 ounces) of clear liquid  • Before bed: take 2 dulcolax tablets    DAY OF THE COLONOSCOPY  • The morning of the test, you can take your other medications at the usual time with only a sip of water. These include blood pressure pills, seizure medications, heart medications, thyroid medications, etc.  • Do not take pills for diabetes. If you have diabetes, please follow the colonoscopy preparation for people with diabetes handout  • Do not drink anything for 3 hours before the test

## 2019-10-18 MED ORDER — SIMETHICONE 80 MG PO TABS
4.00 | ORAL_TABLET | ORAL | 0 refills | Status: AC
Start: 2019-10-18 — End: 2019-10-19

## 2019-10-18 MED ORDER — POLYETHYLENE GLYCOL 3350 17 GM/SCOOP PO POWD
238.00 g | ORAL | 0 refills | Status: AC
Start: 2019-10-18 — End: 2019-10-19

## 2019-10-18 MED ORDER — BISACODYL 5 MG PO TBEC
20.00 mg | DELAYED_RELEASE_TABLET | ORAL | 0 refills | Status: AC
Start: 2019-10-18 — End: 2019-10-19

## 2019-10-24 ENCOUNTER — Other Ambulatory Visit: Payer: Self-pay

## 2019-10-24 ENCOUNTER — Ambulatory Visit: Payer: Commercial Managed Care - HMO | Attending: Internal Medicine

## 2019-10-24 DIAGNOSIS — Z23 Encounter for immunization: Secondary | ICD-10-CM | POA: Insufficient documentation

## 2019-11-03 ENCOUNTER — Encounter (HOSPITAL_BASED_OUTPATIENT_CLINIC_OR_DEPARTMENT_OTHER): Payer: Self-pay | Admitting: Family Medicine

## 2019-11-03 ENCOUNTER — Ambulatory Visit: Payer: Commercial Managed Care - HMO | Attending: Family Medicine | Admitting: Family Medicine

## 2019-11-03 ENCOUNTER — Other Ambulatory Visit: Payer: Self-pay

## 2019-11-03 VITALS — BP 136/84 | HR 80 | Temp 98.0°F | Wt 191.0 lb

## 2019-11-03 DIAGNOSIS — Z0000 Encounter for general adult medical examination without abnormal findings: Secondary | ICD-10-CM | POA: Diagnosis not present

## 2019-11-03 DIAGNOSIS — Z1211 Encounter for screening for malignant neoplasm of colon: Secondary | ICD-10-CM

## 2019-11-03 DIAGNOSIS — R030 Elevated blood-pressure reading, without diagnosis of hypertension: Secondary | ICD-10-CM | POA: Diagnosis present

## 2019-11-03 DIAGNOSIS — R3911 Hesitancy of micturition: Secondary | ICD-10-CM | POA: Insufficient documentation

## 2019-11-03 DIAGNOSIS — Z8673 Personal history of transient ischemic attack (TIA), and cerebral infarction without residual deficits: Secondary | ICD-10-CM | POA: Diagnosis present

## 2019-11-03 DIAGNOSIS — I10 Essential (primary) hypertension: Secondary | ICD-10-CM | POA: Diagnosis not present

## 2019-11-03 DIAGNOSIS — R7303 Prediabetes: Secondary | ICD-10-CM | POA: Diagnosis present

## 2019-11-03 DIAGNOSIS — R195 Other fecal abnormalities: Secondary | ICD-10-CM | POA: Insufficient documentation

## 2019-11-03 NOTE — Assessment & Plan Note (Signed)
-   counseled on exercise  - pt to return for labs as lab closed

## 2019-11-03 NOTE — Progress Notes (Signed)
PRIMARY CARE NOTE - ESTABLISHED PATIENT      SUBJECTIVE  Roy Landry is a 64 year old English-speaking male here for:      Problem List        Cardiac and Vasculature    Elevated blood pressure reading     11/03/2019: BP high normal on arrival 130s/70s, on later recheck by provider 150s/80s, has not yet returned to the gym   10/07/2019: does physical work but no additional exercise given his gym has been closed, planning to restart that, no BP cuff at home  08/20/2018: 146/84 on recheck, concern given recent hx of stroke, pt does not want medications, gave information on the DASH diet and exercise for now            Endocrine and Metabolic    Prediabetes     11/03/2019: only gained 2lbs over past year, only eats one meal a day, does have 1-2 beers daily, somewhat on DASH and mediterranean diet, has not restarted exercising yet   10/07/2019: has gained 5lbs over the past year, has had decreased activity in the pandemic   09/14/2018: advised exercise and continued dietary changes for pre-diabetes, pt agreeable and has made many changes already with his diet   08/23/2018: Hgb A1c 5.7%. RN will call to discuss healthy eating/exercise. Recheck in 1-2 years.            Gastrointestinal and Abdominal    Decreased stool caliber     11/03/2019: having thinner caliber stools, straining to get them out, no weight loss, no fevers, no night sweat, within the last month, no urinary sx aside from rare hesitancy            Genitourinary and Reproductive    Urinary hesitancy     08/20/2018: brought up on ROS, will complete IPSS at f/u visit as well as complete prostate exam, PSA ordered today for screening purposes in case need to start 5aRI            Neuro    History of stroke     10/07/2019: was discharged from neurology as told that everything stabilized   09/14/2018: has neurology appoitment on 4/24 at [facility]   08/20/2018: Jan 2020, started aspirin and statin, was told needed f/u EEG, stopped smoking in 2018, referral placed to Dr.  Caryn Section at [facility] per pt request               Most Recent Weight Reading(s)  11/03/19 : 86.6 kg (191 lb)  08/20/18 : 86.1 kg (189 lb 14.4 oz)            Active Medical Issues:  Patient Active Problem List:     History of stroke     Elevated blood pressure reading     History of colonic polyps     Urinary hesitancy     Prediabetes     Decreased stool caliber      Allergies, medications, and other relevant history reviewed in EpicCare.      OBJECTIVE  BP 136/84   Pulse 80   Temp 98 F (36.7 C) (Temporal)   Wt 86.6 kg (191 lb)   SpO2 96%   BMI 28.95 kg/m   Most Recent Weight Reading(s)  11/03/19 : 86.6 kg (191 lb)  08/20/18 : 86.1 kg (189 lb 14.4 oz)        Most Recent BP Reading(s)  11/03/19 : 136/84  08/20/18 : (!) 146/84      Physical  Exam   Constitutional: He is well-developed, well-nourished, and in no distress. No distress.   HENT:   Head: Normocephalic and atraumatic.   Eyes: Conjunctivae are normal.   Cardiovascular: Normal rate, regular rhythm and normal heart sounds.   No murmur heard.  Pulmonary/Chest: Effort normal. No stridor.   Neurological: He is alert.   Skin: Skin is warm and dry. He is not diaphoretic.   Psychiatric: Affect normal.   Vitals reviewed.        ASSESSMENT AND PLAN  Roy Landry is a 64 year old male who presents with:         Problem List Items Addressed This Visit        Cardiac and Vasculature    Elevated blood pressure reading     - recheck BP at lab only visit   - discuss gym with him on lab f/u televisit             Endocrine and Metabolic    Prediabetes     - counseled on exercise  - pt to return for labs as lab closed             Gastrointestinal and Abdominal    Decreased stool caliber     Concern with decreased caliber of stools, straining, Bristol stool 4 in texture, no bleeding or B-sx  - sent note to Helene Kelp to see if it is possible to get colonscopy moved up sooner than August given thinning of stools may be indicative of a mass effect              Genitourinary and Reproductive    Urinary hesitancy    Relevant Orders    PROSTATIC SPECIFIC ANTIGEN       Neuro    History of stroke    Relevant Orders    COMPREHENSIVE METABOLIC PANEL    HEMOGLOBIN A1C      Other Visit Diagnoses     Colon cancer screening        Relevant Orders    POC IMMUNOASSAY FECAL OCCULT BLOOD TEST    Essential hypertension        Relevant Orders    COMPREHENSIVE METABOLIC PANEL    HEMOGLOBIN A1C            I spent a total of 43 minutes on this visit on the date of service (total time includes all activities performed on the date of service)        ____________    Demetrius Revel, MD   Pager: [phone]  Office phone: [phone]  Email: [email or web address]  [facility]

## 2019-11-03 NOTE — Assessment & Plan Note (Signed)
-   recheck BP at lab only visit   - discuss gym with him on lab f/u televisit

## 2019-11-03 NOTE — Assessment & Plan Note (Signed)
Concern with decreased caliber of stools, straining, Bristol stool 4 in texture, no bleeding or B-sx  - sent note to Roy Landry to see if it is possible to get colonscopy moved up sooner than August given thinning of stools may be indicative of a mass effect

## 2019-11-04 NOTE — Addendum Note (Signed)
Addended by: Demetrius Revel A on: 11/04/2019 11:44 AM     Modules accepted: Orders

## 2019-11-07 ENCOUNTER — Ambulatory Visit: Payer: Commercial Managed Care - HMO | Attending: Family Medicine

## 2019-11-07 DIAGNOSIS — R3911 Hesitancy of micturition: Secondary | ICD-10-CM | POA: Diagnosis present

## 2019-11-07 DIAGNOSIS — Z8673 Personal history of transient ischemic attack (TIA), and cerebral infarction without residual deficits: Secondary | ICD-10-CM

## 2019-11-07 DIAGNOSIS — I10 Essential (primary) hypertension: Secondary | ICD-10-CM | POA: Diagnosis not present

## 2019-11-07 LAB — COMPREHENSIVE METABOLIC PANEL
ALANINE AMINOTRANSFERASE: 76 U/L — ABNORMAL HIGH (ref 12–45)
ALBUMIN: 3.8 g/dL (ref 3.4–5.0)
ALKALINE PHOSPHATASE: 95 U/L (ref 45–117)
ANION GAP: 2 mmol/L — CL (ref 5–15)
ASPARTATE AMINOTRANSFERASE: 51 U/L — ABNORMAL HIGH (ref 8–34)
BILIRUBIN TOTAL: 0.5 mg/dL (ref 0.2–1.0)
BUN (UREA NITROGEN): 14 mg/dL (ref 7–18)
CALCIUM: 9.5 mg/dL (ref 8.5–10.1)
CARBON DIOXIDE: 30 mmol/L (ref 21–32)
CHLORIDE: 111 mmol/L — ABNORMAL HIGH (ref 98–107)
CREATININE: 0.8 mg/dL (ref 0.7–1.2)
ESTIMATED GLOMERULAR FILT RATE: >60 mL/min (ref >60)
Glucose Random: 103 mg/dL (ref 74–160)
POTASSIUM: 4.9 mmol/L (ref 3.5–5.1)
SODIUM: 143 mmol/L (ref 136–145)
TOTAL PROTEIN: 8 g/dL (ref 6.4–8.2)

## 2019-11-07 LAB — PROSTATIC SPECIFIC ANTIGEN: PROSTATIC SPECIFIC ANTIGEN: 1.02 ng/mL (ref 0.000–4.000)

## 2019-11-07 NOTE — Progress Notes (Signed)
Labs drawn

## 2019-11-09 LAB — HEMOGLOBIN A1C
ESTIMATED AVERAGE GLUCOSE: 123 (ref 74–160)
HEMOGLOBIN A1C: 5.9 % — ABNORMAL HIGH (ref 4.0–5.6)

## 2019-11-11 ENCOUNTER — Ambulatory Visit: Payer: Commercial Managed Care - HMO | Attending: Family Medicine | Admitting: Physician Assistant

## 2019-11-11 DIAGNOSIS — R7989 Other specified abnormal findings of blood chemistry: Secondary | ICD-10-CM | POA: Diagnosis present

## 2019-11-11 DIAGNOSIS — R030 Elevated blood-pressure reading, without diagnosis of hypertension: Secondary | ICD-10-CM | POA: Diagnosis present

## 2019-11-11 NOTE — Assessment & Plan Note (Signed)
Suspect d/t NAFLD, EtOH use, statin, or a combination thereof. Asked patient to cut back on EtOH x11mo, then will recheck. If still elevated will obtain labs to assess for hepatitides and hemachromatosis. If nml will proceed with liver U/S. If w/o neg and elevated LFTs persist will consider decreasing atorvastatin. Discussed regular exercise and healthy low-fat diet, discussed avoid excess APAP and EtOH.

## 2019-11-11 NOTE — Assessment & Plan Note (Addendum)
Will ask RN to help patient get home cuff, then have appt for BP check and cuff and diet teaching.  F/u 1 mo, if BP persistently elevated will consider starting ACE-I

## 2019-11-11 NOTE — Progress Notes (Signed)
Roy Landry is a 64 year old male who was contacted via phone today for f/u labs and BP    Problem List     Elevated blood pressure reading     11/11/2019: Not checking BP at home, does not have a cuff. Planning to go to the gym starting on Monday. Working on [employer] and [place]. Denies TIAs, vision changes, chest pain, DOE, edema.    11/03/2019: BP high normal on arrival 130s/70s, on later recheck by provider 150s/80s, has not yet returned to the gym   10/07/2019: does physical work but no additional exercise given his gym has been closed, planning to restart that, no BP cuff at home  08/20/2018: 146/84 on recheck, concern given recent hx of stroke, pt does not want medications, gave information on the DASH diet and exercise for now         Elevated LFTs     11/11/2019: Patient denies known hx hepatitis, elevated LFTs, or other liver disease. Takes atorvastatin 80mg  and ASA 81mg , denies other meds/vitamins/supplements. EtOH: has a beer or two with dinner every day. Denies frequent Tylenol use.     11/07/2019: ALT 76, AST 51. LFTs otherwise wnl. No prior available for comparison.               Patient Active Problem List:     History of stroke     Elevated blood pressure reading     History of colonic polyps     Urinary hesitancy     Prediabetes     Decreased stool caliber     Elevated LFTs    atorvastatin (LIPITOR) 80 MG tablet, Take 1 tablet by mouth daily, Disp: 90 tablet, Rfl: 3  ASPIRIN LOW DOSE 81 MG EC tablet, TAKE 1 TABLET BY MOUTH EVERY DAY, Disp: 90 tablet, Rfl: 0    No current facility-administered medications for this visit.    Review of Patient's Allergies indicates:  No Known Allergies    I have reviewed the past medical, social, and family history. All pertinent historical factors are mentioned in the HPI. The remainder are non-contributory.       OBJECTIVE:  No vitals obtained or physical exam performed (televisit)  On the phone, patient's speech is normal. Thought content grossly nml.  Judgement grossly intact. No coughing, no gasping for air. Speaks in full sentences.    ASSESSMENT&PLAN:    Problem List Items Addressed This Visit     Elevated blood pressure reading    Elevated LFTs     Suspect d/t NAFLD, EtOH use, statin, or a combination thereof. Asked patient to cut back on EtOH x20mo, then will recheck. If still elevated will obtain labs to assess for hepatitides and hemachromatosis. If nml will proceed with liver U/S. If w/o neg and elevated LFTs persist will consider decreasing atorvastatin. Discussed regular exercise and healthy low-fat diet, discussed avoid excess APAP and EtOH.               I spent a total of 30 minutes on this visit on the date of service (total time includes all activities performed on the date of service)      Joaquin Bend, PA-C

## 2019-11-14 ENCOUNTER — Ambulatory Visit: Payer: Commercial Managed Care - HMO | Attending: Internal Medicine

## 2019-11-14 ENCOUNTER — Other Ambulatory Visit: Payer: Self-pay

## 2019-11-14 DIAGNOSIS — Z23 Encounter for immunization: Secondary | ICD-10-CM | POA: Diagnosis not present

## 2019-11-15 ENCOUNTER — Telehealth (HOSPITAL_BASED_OUTPATIENT_CLINIC_OR_DEPARTMENT_OTHER): Payer: Self-pay

## 2019-11-15 NOTE — Telephone Encounter (Signed)
Received VM from PCP requesting to change August colonoscopy date to sooner date, called and left patient a VM to call OA to reschedule date.

## 2019-11-18 ENCOUNTER — Ambulatory Visit: Payer: Commercial Managed Care - HMO | Attending: Family Medicine

## 2019-11-18 DIAGNOSIS — Z1211 Encounter for screening for malignant neoplasm of colon: Secondary | ICD-10-CM | POA: Diagnosis present

## 2019-11-18 LAB — POC IMMUNOASSAY FECAL OCCULT BLOOD TEST: POC FECAL OCCULT BLOOD TEST (IMMUNOASSAY): NEGATIVE

## 2019-11-18 NOTE — Progress Notes (Signed)
Ifob done

## 2019-12-12 ENCOUNTER — Telehealth (HOSPITAL_BASED_OUTPATIENT_CLINIC_OR_DEPARTMENT_OTHER): Payer: Self-pay

## 2019-12-12 NOTE — Telephone Encounter (Signed)
Called to schedule a lab appointment; left message on voicemail asking for a call back.

## 2019-12-12 NOTE — Telephone Encounter (Signed)
-----   Message from Joaquin Bend, [place] sent at 11/11/2019  3:30 PM EDT -----  Regarding: Pt needs lab appt  Please schedule for lab and/or vitals visit as detailed below.    Labs ordered: Yes  Non-urgent vitals needed: Blood pressure and Weight  Specific timeframe, otherwise considered non-urgent:  Chart to be cc'd to the following provider for vitals review: Lia    Copy/paste the above and:    If labs and vitals required:    [facility]: Route to P [facility] LAB ORDERS POOL  [place]: Route to P [place] LAB ORDERS POOL    If labs only required:    [facility]: Route to P [facility] FRONT END  [facility]: Route to P [facility] FRONT END  [facility]: Route to P [facility] LAB ORDERS POOL  [place]: Route to P EC [place] POOL  [place] labs: Give patient number to call to schedule -- [phone]    If symptoms of COVID or COVID within the past 14 days (yes to questions 1 or 2):    Res[facility] Clinic: Route to Primary Care Lab Requests

## 2019-12-14 ENCOUNTER — Telehealth (HOSPITAL_BASED_OUTPATIENT_CLINIC_OR_DEPARTMENT_OTHER): Payer: Self-pay | Admitting: Registered Nurse

## 2019-12-14 ENCOUNTER — Other Ambulatory Visit: Payer: Self-pay

## 2019-12-14 ENCOUNTER — Ambulatory Visit: Payer: Commercial Managed Care - HMO | Attending: Physician Assistant

## 2019-12-14 VITALS — BP 133/67 | Wt 187.6 lb

## 2019-12-14 DIAGNOSIS — R7989 Other specified abnormal findings of blood chemistry: Secondary | ICD-10-CM | POA: Diagnosis present

## 2019-12-14 MED ORDER — OTHER MEDICATION
0 refills | Status: AC
Start: 2019-12-14 — End: 2020-11-12

## 2019-12-14 NOTE — Progress Notes (Signed)
BP 133/67   Wt 85.1 kg (187 lb 9.6 oz)   BMI 28.43 kg/m     1 sst

## 2019-12-14 NOTE — Telephone Encounter (Signed)
No further action required

## 2019-12-14 NOTE — Telephone Encounter (Signed)
S- Blood Pressure management orders and letter of med necessity    B- entered orders in NORMAL drug class so can print out   at PCP location for immediate signature.    Letter of Medical Necessity PENDED for him/her to review,change if necessary,print out and sign.Then can SIGN at his/her location.    The above along with [place] page listed in [place], Rprts tab can be faxed to [place]    A- Blood Pressure Management B/P Monitor Orders,& Letter of Med Necessity    R- CC to Team Physician for Approval,Signature, and then HARDCOPIES to be faxed by PCR  To [place] [facility]  FAX [phone].

## 2019-12-15 ENCOUNTER — Encounter (HOSPITAL_BASED_OUTPATIENT_CLINIC_OR_DEPARTMENT_OTHER): Payer: Self-pay | Admitting: Physician Assistant

## 2019-12-15 ENCOUNTER — Other Ambulatory Visit (HOSPITAL_BASED_OUTPATIENT_CLINIC_OR_DEPARTMENT_OTHER): Payer: Self-pay | Admitting: Physician Assistant

## 2019-12-15 DIAGNOSIS — R7989 Other specified abnormal findings of blood chemistry: Secondary | ICD-10-CM

## 2019-12-15 LAB — HEPATIC FUNCTION PANEL
ALANINE AMINOTRANSFERASE: 74 U/L — ABNORMAL HIGH (ref 12–45)
ALBUMIN: 4.1 g/dL (ref 3.4–5.0)
ALKALINE PHOSPHATASE: 90 U/L (ref 45–117)
ASPARTATE AMINOTRANSFERASE: 45 U/L — ABNORMAL HIGH (ref 8–34)
BILIRUBIN DIRECT: 0.2 mg/dl (ref 0.0–0.2)
BILIRUBIN TOTAL: 0.7 mg/dL (ref 0.2–1.0)
INDIRECT BILIRUBIN: 0.5 mg/dL (ref 0.2–0.9)
TOTAL PROTEIN: 7.7 g/dL (ref 6.4–8.2)

## 2019-12-15 LAB — HEPATITIS C ANTIBODY: HEPATITIS C ANTIBODY: NONREACTIVE

## 2019-12-15 LAB — FERRITIN: FERRITIN: 189 ng/mL (ref 26–388)

## 2019-12-15 LAB — HEPATITIS B SURFACE ANTIGEN: HEPATITIS B SURFACE ANTIGEN: NONREACTIVE

## 2019-12-15 LAB — HEPATITIS B SURFACE AB QUANT: HEPATITIS B SURFACE AB QUANT: <3.1 m[IU]/mL (ref 0.00–9.99)

## 2019-12-15 NOTE — Progress Notes (Signed)
LFTs stably elevated. Will screen for hemochromatosis, Hep B and C and obtain liver U/S to assess for fatty liver. If w/o neg will consider decreasing dose of atorvastatin. Patient has hx CVA, so I am hesitant to lower statin dose unless absolutely necessary. May need to consider adding ezetimibe if statin dose lowered or d/c.

## 2019-12-16 ENCOUNTER — Telehealth (HOSPITAL_BASED_OUTPATIENT_CLINIC_OR_DEPARTMENT_OTHER): Payer: Self-pay | Admitting: Surgery

## 2019-12-16 NOTE — Telephone Encounter (Signed)
2nd call  Message left for patient to return my call.

## 2019-12-16 NOTE — Telephone Encounter (Signed)
1st call  Message left for patient to return my call.

## 2019-12-16 NOTE — Progress Notes (Signed)
Dear RN,    Please:    1. Create Telephone encounter for this patient.  2. Share with the patient the attached results. LFTs still a bit high, stable. No Hepatitis infection, no iron overload. Nonimmune to Hep B    Plan:  1. Patient needs to be seen in clinic for Hep B vaccine.  I have also ordered a liver ultrasound, nonurgent    2. Type of Outreach: 3 phone calls and if unable to reach send letter    3. Document the conversation in the Telephone Encounter and close the encounter, no need to send back to me.     Thank you,  Joaquin Bend, PA-C

## 2019-12-16 NOTE — Telephone Encounter (Signed)
Patient called back, stated that he is returning Ritza's call. Patient can be reached at [phone]

## 2019-12-16 NOTE — Telephone Encounter (Signed)
Called and left voicemail for patient to call the office back to confirm Colonoscopy scheduled on Friday 12/23/19 with Dr. Mamie Levers with an arrival time at 11:20 am.  Instructed to call back to confirm and to have any questions answered.  Provided patient the office number where he can reach [place].

## 2019-12-19 ENCOUNTER — Telehealth (HOSPITAL_BASED_OUTPATIENT_CLINIC_OR_DEPARTMENT_OTHER): Payer: Self-pay

## 2019-12-19 NOTE — Telephone Encounter (Signed)
Joaquin Bend, PA-C  P Cfh/N Rn Triage  Dear RN,     Please:    1. Create Telephone encounter for this patient.   2. Share with the patient the attached results. LFTs still a bit high, stable. No Hepatitis infection, no iron overload. Nonimmune to Hep B     Plan:   1. Patient needs to be seen in clinic for Hep B vaccine.   I have also ordered a liver ultrasound, nonurgent     2. Type of Outreach: 3 phone calls and if unable to reach send letter     3. Document the conversation in the Telephone Encounter and close the encounter, no need to send back to me.     Thank you,   Joaquin Bend, PA-C       3rd call   Message left for patient to return my call.    Letter will be send home

## 2019-12-20 ENCOUNTER — Other Ambulatory Visit: Payer: Self-pay

## 2019-12-20 ENCOUNTER — Ambulatory Visit: Payer: Commercial Managed Care - HMO | Attending: Internal Medicine

## 2019-12-20 ENCOUNTER — Telehealth (HOSPITAL_BASED_OUTPATIENT_CLINIC_OR_DEPARTMENT_OTHER): Payer: Self-pay

## 2019-12-20 DIAGNOSIS — Z1159 Encounter for screening for other viral diseases: Secondary | ICD-10-CM | POA: Insufficient documentation

## 2019-12-20 NOTE — Telephone Encounter (Signed)
error 

## 2019-12-20 NOTE — Progress Notes (Signed)
Nasal swab collected

## 2019-12-21 ENCOUNTER — Encounter (HOSPITAL_BASED_OUTPATIENT_CLINIC_OR_DEPARTMENT_OTHER): Payer: Self-pay | Admitting: Family Medicine

## 2019-12-21 LAB — COVID-19 OUTPATIENT: COVID-19 OUTPATIENT: NEGATIVE

## 2019-12-22 NOTE — Anesthesia Preprocedure Evaluation (Addendum)
Pre-Anesthetic Note  .  [place] AN TELEVISIT:   Is this a televisit?: No          Patient: Roy Landry is a 64 year old male      Procedure Information     Date/Time: 12/23/19 1150    Scheduled providers: Marthe Patch, MD; Shellee Milo, MD    Procedure: COLONOSCOPY    Diagnosis:       Colon cancer screening [Z12.11]      Decreased stool caliber [R19.5]    Location: [facility] - Gastroenterology          Relevant Problems   NEURO/PSYCH   (+) History of colonic polyps   (+) History of stroke       _0 @      Previous Anesthetic History:   No past surgical history on file.    Current Medications:    OTHER MEDICATION, Dispense 1 blood pressure monitor w/kit to check B/P daily.ICD Z86.73 History of stroke  & R03.0 Elevated B/P reading., Disp: 1 Device, Rfl: 0  atorvastatin (LIPITOR) 80 MG tablet, Take 1 tablet by mouth daily, Disp: 90 tablet, Rfl: 3  ASPIRIN LOW DOSE 81 MG EC tablet, TAKE 1 TABLET BY MOUTH EVERY DAY, Disp: 90 tablet, Rfl: 0    No current facility-administered medications for this visit.      Home Medications  (Not in a hospital admission)      Allergies:   Review of Patient's Allergies indicates:  No Known Allergies    Smoking, Alcohol, Drugs:  Social History    Tobacco Use      Smoking status: Former Smoker      Smokeless tobacco: Never Used      Tobacco comment: quit in 2018    Alcohol use: Yes      Comment: social drinker, >5 drinks 1-2x years, drinks one beer daily       Drug use: Unknown       PMHx:  Past Medical History:  07/18/2018: Stroke (cerebrum) ([place])    Vitals  There were no vitals taken for this visit.    Review of Systems     Patient summary reviewed      Anesthetic History:   negative anesthesia history ROS           Cardiovascular: Hypertension: Elevated BP without HTN diagnosis.   Physical Activity in METs greater than 4    Pulmonary: Negative.    GU/Renal: Positive for Urinary hesitancy .   Hepatic: Positive for other liver disease (Elevated  transaminases).   Neurological: Positive for strokes (2020, likely lacunar, no residuals).   Gastrointestinal:        Decreased stool caliber    Hematological: Negative.    Endocrine: Diabetes: PreDM.   HEENT: Negative.     Musculoskeletal: Negative.    Psychiatric: Negative.    Constitutional: Negative.    Skin: Negative.        Physical Exam    General     Level of consciousness:  Alert   Airway     Mallampati:  II    TM distance:  >3 FB    Mouth opening:  >3 FB    Neck ROM:  Full   Teeth    (+) upper dentures  Comments: Lower bridge}   Heart   - normal exam     Lungs - normal exam               Pertinent Labs:  Lab Results   Component Value Date    NA 143 11/07/2019    K 4.9 11/07/2019    CREAT 0.8 11/07/2019    GLUCOSER 103 11/07/2019         Anesthesia Plan    ASA Score:     ASA:  2    Airway:      Mallampati:  II    Mouth opening:  >3 FB    Neck ROM:  Full    TM distance:  >3 FB    Plan: general and MAC    Other information:     EKG Reviewed: : No      Full Stomach Precaution:: No      Post-Plan::  PACU    Anesthesia Assessment and Plan:        MAC/GA    Informed Consent:     Anesthetic plan and risks discussed with:  Patient   Patient Consented        Attending Anesthesiologist Statement:     Reassessed day of surgery: Yes        Assessment made, necessary equipment and appropriate plan in place.

## 2019-12-23 ENCOUNTER — Encounter (HOSPITAL_BASED_OUTPATIENT_CLINIC_OR_DEPARTMENT_OTHER): Payer: Self-pay | Admitting: Anesthesiology

## 2019-12-23 ENCOUNTER — Ambulatory Visit
Admission: RE | Admit: 2019-12-23 | Discharge: 2019-12-23 | Disposition: A | Discharge status: Home / Self Care | Payer: Commercial Managed Care - HMO | Attending: Family Medicine | Admitting: Family Medicine

## 2019-12-23 ENCOUNTER — Encounter (HOSPITAL_BASED_OUTPATIENT_CLINIC_OR_DEPARTMENT_OTHER): Payer: Self-pay

## 2019-12-23 ENCOUNTER — Other Ambulatory Visit: Payer: Self-pay

## 2019-12-23 ENCOUNTER — Ambulatory Visit (HOSPITAL_BASED_OUTPATIENT_CLINIC_OR_DEPARTMENT_OTHER): Payer: Self-pay | Admitting: Anesthesiology

## 2019-12-23 DIAGNOSIS — Z8673 Personal history of transient ischemic attack (TIA), and cerebral infarction without residual deficits: Secondary | ICD-10-CM

## 2019-12-23 DIAGNOSIS — K5730 Diverticulosis of large intestine without perforation or abscess without bleeding: Secondary | ICD-10-CM | POA: Diagnosis not present

## 2019-12-23 DIAGNOSIS — K635 Polyp of colon: Secondary | ICD-10-CM | POA: Diagnosis not present

## 2019-12-23 DIAGNOSIS — Z1211 Encounter for screening for malignant neoplasm of colon: Secondary | ICD-10-CM | POA: Diagnosis not present

## 2019-12-23 DIAGNOSIS — K648 Other hemorrhoids: Secondary | ICD-10-CM

## 2019-12-23 DIAGNOSIS — K621 Rectal polyp: Secondary | ICD-10-CM | POA: Diagnosis not present

## 2019-12-23 DIAGNOSIS — R195 Other fecal abnormalities: Secondary | ICD-10-CM

## 2019-12-23 DIAGNOSIS — D128 Benign neoplasm of rectum: Secondary | ICD-10-CM

## 2019-12-23 DIAGNOSIS — R194 Change in bowel habit: Secondary | ICD-10-CM | POA: Diagnosis not present

## 2019-12-23 DIAGNOSIS — K5790 Diverticulosis of intestine, part unspecified, without perforation or abscess without bleeding: Secondary | ICD-10-CM

## 2019-12-23 MED ORDER — PROPOFOL 200 MG/20 ML IV - AN
Freq: Once | INTRAVENOUS | Status: DC | PRN
Start: 2019-12-23 — End: 2019-12-23
  Administered 2019-12-23: 200 mg via INTRAVENOUS
  Administered 2019-12-23: 50 mg via INTRAVENOUS

## 2019-12-23 MED ORDER — LACTATED RINGERS IV SOLN
INTRAVENOUS | Status: DC
Start: 2019-12-23 — End: 2019-12-24
  Administered 2019-12-23: 300 mL via INTRAVENOUS
  Administered 2019-12-23: 75 mL/h via INTRAVENOUS

## 2019-12-23 MED ORDER — LIDOCAINE HCL (PF) 2 % IJ SOLN
Freq: Once | INTRAMUSCULAR | Status: DC | PRN
Start: 2019-12-23 — End: 2019-12-23
  Administered 2019-12-23: 3 mL via INTRAVENOUS

## 2019-12-23 NOTE — H&P (Signed)
GI Pre-procedure History and Physical Short Form  Roy Landry is an 64 year old male.    Chief Complaint: He is here for Colonoscopy    HPI: 64 yo man with history of colon polyps and change in stool caliber. Here for colonoscopy. Has always had some variation in stools. Last colonoscopy was over 15 years ago. No family history of colon cancer. FOBT negative.    Active Problems:  Patient Active Problem List:     History of stroke     Elevated blood pressure reading     History of colonic polyps     Urinary hesitancy     Prediabetes     Decreased stool caliber     Elevated LFTs      Past Medical History:   Past Medical History:  07/18/2018: Stroke (cerebrum) [facility])    Past Surgical History:  History reviewed. No pertinent surgical history.    Social History:  Social History     Socioeconomic History   . Marital status: Divorced     Spouse name: Not on file   . Number of children: Not on file   . Years of education: Not on file   . Highest education level: Not on file   Occupational History   . Not on file   Tobacco Use   . Smoking status: Former Research scientist (life sciences)   . Smokeless tobacco: Never Used   . Tobacco comment: quit in 2018   Substance and Sexual Activity   . Alcohol use: Yes     Comment: social drinker, >5 drinks 1-2x years, drinks one beer daily    . Drug use: Never   . Sexual activity: Yes     Partners: Female     Comment: partner is 62    Other Topics Concern   . Not on file   Social History Narrative    08/20/2018: from the [place] originally, [place], two kids, works in [employer], likes to exercise stationary bike/weights, motivated not to take medications    Social Determinants of Librarian, academic Strain:     Difficulty of Paying Living Expenses:   Food Insecurity:     Worried About Charity fundraiser in the Last Year:     Arboriculturist in the Last Year:   Transportation Needs:     Film/video editor (Medical):     Lack of Transportation (Non-Medical):   Physical  Activity:     Days of Exercise per Week:     Minutes of Exercise per Session:   Stress:     Feeling of Stress :   Social Connections:     Frequency of Communication with Friends and Family:     Frequency of Social Gatherings with Friends and Family:     Attends Religious Services:     Active Member of Clubs or Organizations:     Attends Music therapist:     Marital Status:   Intimate Partner Violence:     Fear of Current or Ex-Partner:     Emotionally Abused:     Physically Abused:     Sexually Abused:     Family History:  Review of patient's family history indicates:  Problem: Diabetes      Relation: Mother          Age of Onset: (Not Specified)  Problem: Diabetes      Relation: Sister          Age  of Onset: (Not Specified)      Allergies:   Review of Patient's Allergies indicates:  No Known Allergies    Medications:   (Not in a hospital admission)      Review of Systems:  No CP, SOB, bleeding problems.      Physical Exam:  Vital Signs: Height 5\' 9"  (1.753 m)   Weight 83.9 kg (185 lb)   Body Mass Index 27.32 kg/m   General: Average body habitus.   HEENT: Normocephalic, atrumatic, PERRLA, Extra occular motions intact. No scleral icterus. Pharynx benign. Tongue midline.  Airway Evaluation:  Gag reflex intact: Yes  Ability to open mouth wide:   Full  Dentures:  Limited  Loose teeth:  No  Neck range of motion  Full  Mallampati Airway Classification: Class II   The same as Class I except the tonsilar pillars are hidden by the tongue.  Pulmonary: Clear to auscultation and percussion. No rales, wheezes or ronchi.  Cardiovascular: S1, S2, no S3, S4, clicks, rubs or murmurs.  JVD not elevated with patient sitting.  Abdominal: Normal bowel sounds. No tenderness on light or deep palpation. No hepatomegally or spleenomegally.  Extremities: Without clubbing, cyanosis or edema.   Neurological: No asterixis. Sensation intact. Cranial nerves II - XII grossly intact. Normal gait.   Derm:  No jaundice, hives or rashes.    ASA Classification: ASA Class II (a patient with mild systemic disease)    Impression:  Proceed with colonoscopy.    Medical Decision Making:  We discussed the risks and details of the procedure. Consent obtained. All questions addressed.

## 2019-12-23 NOTE — PROVATION-GI (Signed)
[facility]  Patient Name: Roy Landry  MRN: [id number]  CSN: [id number]  Date of Birth: [birth year]  Admit Type: Outpatient  Age: 64  Gender: Male  Note Status: Finalized  Patient Location: [place]  Referring MD:          Darrol Poke. Randall  Procedure Date:        12/23/2019 12:33:37 PM  Procedure:             Colonoscopy  Endoscopist:           Vernis Cabacungan K. Mamie Levers, MD  Indications for Procedure:       Change in bowel habits  Medications:           Monitored Anesthesia Care  Procedure:       Just prior to the procedure, an updated history and physical was done. I        obtained an informed consent from the patient reviewing the risk of the        procedure including (but not limited to) respiratory depression, perforation,        bleeding, discomfort, a possible need for surgery and unexpected reactions to        medications. The patient is aware that test has limitations and may not        detect significant lesions such as cancer or other potential diseases. The        patient was also informed that they might need a repeat colonoscopy earlier        than standard guidelines if there are changes in their symptoms or concerning        findings noted. A time out was performed with the entire procedure staff        present. The scope was passed under direct vision. Throughout the procedure,        the patient's blood pressure, pulse, and oxygen saturations were monitored        continuously. The [id number] was introduced through the anus and        advanced to the cecum, identified by appendiceal orifice and ileocecal valve.        The scope was then slowly withdrawn with confirmation of the noted findings.        The colonoscopy was performed without difficulty. The patient tolerated the        procedure well. The quality of the bowel preparation was good. Scope        withdrawal time was 9 minutes.  Findings:       The perianal and digital rectal examinations were normal.       A 2 mm polyp was  found in the transverse colon. The polyp was removed with a        jumbo cold forceps. Resection and retrieval were complete. Estimated blood        loss: none.       Two polyps were found in the rectum. The polyps were 1 to 2 mm in size. These        polyps were removed with a jumbo cold forceps. Resection and retrieval were        complete. Estimated blood loss: none.       Multiple small and large-mouthed diverticula were found in the sigmoid colon.        There was no evidence of diverticular bleeding.       Non-bleeding internal hemorrhoids were found during retroflexion. The        hemorrhoids  were moderate.  Post Procedure Diagnosis:       - One 2 mm polyp in the transverse colon, removed with a jumbo cold forceps.        Resected and retrieved.       - Two 1 to 2 mm polyps in the rectum, removed with a jumbo cold forceps.        Resected and retrieved.       - Moderate diverticulosis in the sigmoid colon. There was no evidence of        diverticular bleeding.       - Non-bleeding internal hemorrhoids.  Complications:         No immediate complications. Estimated blood loss: None.  Recommendation:       - Repeat colonoscopy in 5 years for surveillance based on pathology results.       - High fiber diet indefinitely.  Theora Gianotti, MD  12/23/2019 1:23:43 PM  This report has been signed electronically.  Number of Addenda: 0  Note Initiated On: 12/23/2019 12:33 PM

## 2019-12-23 NOTE — Discharge Instructions (Signed)
GI CENTER DISCHARGE INSTRUCTIONS     When you return home, you may feel sleepy. Get plenty of rest for the remainder of the day.     If you received sedation for your procedure DO NOT DRIVE, OPERATE MACHINERY OR MAKE IMPORTANT DECISIONS for the reminder of the day.     It is normal after having a COLONOSCOPY to feel a little gassy and bloated, but if you develop SEVERE ABDOMINAL PAIN call your doctor immediately.     It is normal after having a COLONOSCOPY to see a small amount of blood after your first few bowel movements, but if you see a LARGE AMOUNT OF BRIGHT RED BLOOD, call your doctor immediately.          If you had a biopsy or Polypectomy you will need o hold aspirin or medications containing aspirin for   day/s.          If you had a biopsy or Polypectomy you will need to hold any medication such as Advil, Motrin, Naproxin, Ibuprofen etc. for days.      Call your doctor immediately if you develop:   -  Chest Pain   -  Shortness of breath   -  Difficulty swallowing   -  Vomit bright red blood   -  Notice your bowel movements are black or maroon colored   -  You feel weak and tired     Call your physician for any unusual symptoms.     If for any reason you are unable to reach your doctor go to the nearest         [place].     SPECIFIC INSTRUCTIONS:  ***    Follow-up appointment: ***    Prescription: {yes [id number]}   Date MD Name: Telephone:   12/23/2019 No att. providers found ***

## 2019-12-23 NOTE — Anesthesia Postprocedure Evaluation (Signed)
Anesthesia Post-Operative Evaluation Note    Patient: Roy Landry           Procedure Summary     Date: 12/23/19 Room / Location: [facility] - Gastroenterology    Anesthesia Start: 9458 Anesthesia Stop: 5929    Procedure: COLONOSCOPY Diagnosis:       Colon cancer screening      Decreased stool caliber    Scheduled Providers: Marthe Patch, MD; Bertrum Sol, MD Responsible Provider: Bertrum Sol, MD    Anesthesia Type: general, MAC ASA Status: 2            POST-OPERATIVE EVALUATION    Anesthesia Post Evaluation    Vitals signs in patient's normal range: Yes  Respiratory function stable; airway patent: Yes  Cardiovascular function stable: Yes  Hydration status stable: Yes  Mental status recovered; patient participates in evaluation and/or is at baseline: Yes  Pain control satisfactory: Yes  Nausea and vomiting control satisfactory: Yes    Procedure was labor & delivery no  PostOP disposition PACU  Anesthesia Observation no significant observation      Last vitals  Vitals Value Taken Time   BP 111/72 12/23/19 1321   Temp 97.9 12/23/19 1322   Pulse 70 12/23/19 1322   Resp 11 12/23/19 1322   SpO2 97 % 12/23/19 1322   Vitals shown include unvalidated device data.

## 2019-12-27 ENCOUNTER — Other Ambulatory Visit (HOSPITAL_BASED_OUTPATIENT_CLINIC_OR_DEPARTMENT_OTHER): Payer: Self-pay | Admitting: Physician Assistant

## 2019-12-27 NOTE — Telephone Encounter (Signed)
PER Pharmacy, Berman Grainger is a 64 year old male has requested a refill of aspirin 81.      Last Office Visit: 11-11-19 with Joaquin Bend  Last Physical Exam: 11-03-19    There are no preventive care reminders to display for this patient.    Other Med Adult:  Most Recent BP Reading(s)  12/23/19 : 127/70        Cholesterol (mg/dL)   Date Value   08/20/2018 130     LOW DENSITY LIPOPROTEIN DIRECT (mg/dL)   Date Value   08/20/2018 71     HIGH DENSITY LIPOPROTEIN (mg/dL)   Date Value   08/20/2018 45     TRIGLYCERIDES (mg/dL)   Date Value   08/20/2018 78         No results found for: TSHSC      No results found for: TSH    HEMOGLOBIN A1C (%)   Date Value   11/07/2019 5.9 (H)       No results found for: POCA1C      No results found for: INR    SODIUM (mmol/L)   Date Value   11/07/2019 143       POTASSIUM (mmol/L)   Date Value   11/07/2019 4.9           CREATININE (mg/dL)   Date Value   11/07/2019 0.8       Documented patient preferred pharmacies:    [id number] - [place], [place] [address]  Phone: [phone] Fax: [phone]

## 2019-12-28 ENCOUNTER — Other Ambulatory Visit (HOSPITAL_BASED_OUTPATIENT_CLINIC_OR_DEPARTMENT_OTHER): Payer: Self-pay | Admitting: Physician Assistant

## 2019-12-28 ENCOUNTER — Other Ambulatory Visit: Payer: Self-pay

## 2019-12-28 ENCOUNTER — Ambulatory Visit
Admission: RE | Admit: 2019-12-28 | Discharge: 2019-12-28 | Disposition: A | Discharge status: Home / Self Care | Payer: Commercial Managed Care - HMO | Attending: Physician Assistant | Admitting: Physician Assistant

## 2019-12-28 DIAGNOSIS — K760 Fatty (change of) liver, not elsewhere classified: Secondary | ICD-10-CM | POA: Diagnosis present

## 2019-12-28 DIAGNOSIS — R7989 Other specified abnormal findings of blood chemistry: Secondary | ICD-10-CM | POA: Insufficient documentation

## 2019-12-30 LAB — SURGICAL PATH SPECIMEN GASTROINTESTINAL

## 2020-01-01 ENCOUNTER — Other Ambulatory Visit (HOSPITAL_BASED_OUTPATIENT_CLINIC_OR_DEPARTMENT_OTHER): Payer: Self-pay | Admitting: Physician Assistant

## 2020-01-01 DIAGNOSIS — R7989 Other specified abnormal findings of blood chemistry: Secondary | ICD-10-CM

## 2020-01-01 NOTE — Progress Notes (Signed)
Dear RN,    Please:    1. Create Telephone encounter for this patient.  2. Share with the patient the attached results - U/S shows fatty liver, as expected. It showed mild to moderate fibrosis (scar tissue).     Plan:  1. A referral to Gastroenterology will be made. Discuss lifestyle for NAFLD (low fat diet, exercise, avoid excess EtOH and APAP)    2. Type of Outreach: 3 phone calls and if unable to reach send letter    3. Document the conversation in the Telephone Encounter and close the encounter, no need to send back to me.     Thank you,  Joaquin Bend, PA-C

## 2020-01-02 ENCOUNTER — Telehealth (HOSPITAL_BASED_OUTPATIENT_CLINIC_OR_DEPARTMENT_OTHER): Payer: Self-pay

## 2020-01-02 NOTE — Telephone Encounter (Signed)
Pt returned call.  [phone]

## 2020-01-02 NOTE — Telephone Encounter (Signed)
Spoke w/ pt  Informed pt of results per Lia PA-C msg  Pt understands and agrees  Pt should be hearing back from GI/ Med Spec  Also gave pt # for Med Spec [phone]

## 2020-01-02 NOTE — Telephone Encounter (Signed)
Joaquin Bend, PA-C  P Cfh/N Rn  Dear RN,     Please:    1. Create Telephone encounter for this patient.   2. Share with the patient the attached results - U/S shows fatty liver, as expected. It showed mild to moderate fibrosis (scar tissue).     Plan:   1. A referral to Gastroenterology will be made. Discuss lifestyle for NAFLD (low fat diet, exercise, avoid excess EtOH and APAP)     2. Type of Outreach: 3 phone calls and if unable to reach send letter     3. Document the conversation in the Telephone Encounter and close the encounter, no need to send back to me.     Thank you,   Joaquin Bend, PA-C     1st call  Message left for patient to return my call.

## 2020-01-07 DIAGNOSIS — I10 Essential (primary) hypertension: Secondary | ICD-10-CM | POA: Diagnosis not present

## 2020-01-19 ENCOUNTER — Encounter (HOSPITAL_BASED_OUTPATIENT_CLINIC_OR_DEPARTMENT_OTHER): Payer: Self-pay | Admitting: Surgery

## 2020-01-27 ENCOUNTER — Other Ambulatory Visit (HOSPITAL_BASED_OUTPATIENT_CLINIC_OR_DEPARTMENT_OTHER): Payer: Self-pay

## 2020-04-18 ENCOUNTER — Telehealth (HOSPITAL_BASED_OUTPATIENT_CLINIC_OR_DEPARTMENT_OTHER): Payer: Self-pay | Admitting: Registered Nurse

## 2020-04-19 NOTE — Telephone Encounter (Signed)
S- Blood pressure f/u    B- Called pt.  He gave me 2 pt identifiers.  He has not been monitoring B/P at home.  Asked if he has a cuff,he said he did but that his B/P will likely be up all the time  Due to stress.    Reviewed recent notes and concerns re; B/P .  He is a stone mason,working with granite daily,very physical labor.  Per pt ,feels since starting Atorvastatin ,his B/P is elevated and now has concerns re: pre diabetes.  explained to nurse does not like meds.    Reviewed problem list.  Explained how these dx at age 64 are unfortunately not uncommon.  Asked about daily intake.  Per pt eats 1 large meal a day at the end of the day.  Only has coffee in AM when gets up for work at 4:15 PM.  Does not eat or drink much during the day.    Reviewed glucose use in the body,balanced intake,stress,impact of pre diabetes in the unbalance of metabolism with fats and sugars hence leading to plaque build up in arteries.This then increases blood pressure.Over time explained under treated or Untreated HTN,DM,Cholesterol sets him up for strokes,MI,damage to eyes kidneys.    As for stress, in management may be lifting stone but cardio not enough.    At this point ,pt agreed needed f/u and would like to discuss future intervention to avoid stroke MI etc..    First in person is Nov 29.  Booked pt for 3:20 PM that day.He would also like labs drwn then too please once he speaks with PCP.    A- blood pressure f/u call    R- pt to be in next month at first available late afternoon visit    Dalbert Batman, BSN, 04/19/2020

## 2020-05-21 ENCOUNTER — Ambulatory Visit: Payer: Commercial Managed Care - HMO | Attending: Family Medicine | Admitting: Family Medicine

## 2020-05-21 ENCOUNTER — Other Ambulatory Visit: Payer: Self-pay

## 2020-05-21 ENCOUNTER — Encounter (HOSPITAL_BASED_OUTPATIENT_CLINIC_OR_DEPARTMENT_OTHER): Payer: Self-pay | Admitting: Family Medicine

## 2020-05-21 VITALS — BP 140/76 | HR 78 | Temp 97.8°F | Ht 68.9 in | Wt 185.0 lb

## 2020-05-21 DIAGNOSIS — Z8673 Personal history of transient ischemic attack (TIA), and cerebral infarction without residual deficits: Secondary | ICD-10-CM | POA: Diagnosis not present

## 2020-05-21 DIAGNOSIS — R7303 Prediabetes: Secondary | ICD-10-CM | POA: Insufficient documentation

## 2020-05-21 DIAGNOSIS — R7401 Elevation of levels of liver transaminase levels: Secondary | ICD-10-CM | POA: Diagnosis present

## 2020-05-21 DIAGNOSIS — Z23 Encounter for immunization: Secondary | ICD-10-CM | POA: Insufficient documentation

## 2020-05-21 DIAGNOSIS — R7989 Other specified abnormal findings of blood chemistry: Secondary | ICD-10-CM

## 2020-05-21 LAB — LIPID PANEL
Cholesterol: 143 mg/dL (ref 0–239)
HIGH DENSITY LIPOPROTEIN: 55 mg/dL (ref 40–?)
LOW DENSITY LIPOPROTEIN DIRECT: 68 mg/dL (ref 0–189)
TRIGLYCERIDES: 77 mg/dL (ref 0–150)

## 2020-05-21 LAB — COMPREHENSIVE METABOLIC PANEL
ALANINE AMINOTRANSFERASE: 56 U/L — ABNORMAL HIGH (ref 12–45)
ALBUMIN: 3.5 g/dL (ref 3.4–5.0)
ALKALINE PHOSPHATASE: 91 U/L (ref 45–117)
ANION GAP: 6 mmol/L (ref 5–15)
ASPARTATE AMINOTRANSFERASE: 37 U/L — ABNORMAL HIGH (ref 8–34)
BILIRUBIN TOTAL: 0.5 mg/dL (ref 0.2–1.0)
BUN (UREA NITROGEN): 20 mg/dL — ABNORMAL HIGH (ref 7–18)
CALCIUM: 8.9 mg/dL (ref 8.5–10.1)
CARBON DIOXIDE: 28 mmol/L (ref 21–32)
CHLORIDE: 105 mmol/L (ref 98–107)
CREATININE: 0.7 mg/dL (ref 0.7–1.2)
ESTIMATED GLOMERULAR FILT RATE: >60 mL/min (ref >60)
Glucose Random: 87 mg/dL (ref 74–160)
POTASSIUM: 4.5 mmol/L (ref 3.5–5.1)
SODIUM: 139 mmol/L (ref 136–145)
TOTAL PROTEIN: 7.8 g/dL (ref 6.4–8.2)

## 2020-05-21 NOTE — Progress Notes (Signed)
PRIMARY CARE NOTE - ESTABLISHED PATIENT    [phone]      SUBJECTIVE  Roy Landry is a 64 year old English-speaking male here for:    Blood Pressure (check) and Med Question    (obtained by [place] and reviewed)      Problem List        Endocrine and Metabolic    Prediabetes       Gastrointestinal and Abdominal    Elevated LFTs     05/22/2020: LFTs elevated since May 2021, no prior labs, neg testing for for hemochromatosis, Hep B and C, only one beer nightly, likely related to atorvastatin use but hesistant to lower given hx of CVA, will instead switch to rosuvastatin 20mg  and recheck LFTs in 6wks              Neuro    History of stroke     10/07/2019: was discharged from neurology as told that everything stabilized   09/14/2018: has neurolog[facility] appoitment on 4/24 at [facility]   08/20/2018: Jan 2020, started aspirin and statin, was told needed f/u EEG, stopped smoking in 2018, referral placed to Dr. Caryn Section at [facility] per pt request                   Active Medical Issues:  Patient Active Problem List:     History of stroke     Elevated blood pressure reading     History of colonic polyps     Urinary hesitancy     Prediabetes     Decreased stool caliber     Elevated LFTs        OBJECTIVE  BP (!) 140/76   Pulse 78   Temp 97.8 F (36.6 C) (Temporal)   Ht 5' 8.9" (1.75 m)   Wt 83.9 kg (185 lb)   SpO2 96%   BMI 27.40 kg/m   Most Recent Weight Reading(s)  05/21/20 : 83.9 kg (185 lb)  12/23/19 : 83.9 kg (185 lb)  12/14/19 : 85.1 kg (187 lb 9.6 oz)  11/03/19 : 86.6 kg (191 lb)  08/20/18 : 86.1 kg (189 lb 14.4 oz)                Most Recent BP Reading(s)  05/21/20 : (!) 140/76  12/23/19 : 127/70  12/14/19 : 133/67  11/03/19 : 136/84  08/20/18 : (!) 146/84      Physical Exam  Physical Exam   Constitutional: appears well-developed and well-nourished. No distress.   HENT:   Head: Normocephalic and atraumatic.   Eyes: Conjunctivae are normal.   Pulmonary/Chest: Effort normal. No stridor.   Musculoskeletal: Normal  range of motion.   Neurological: Coordination normal.   Skin: Skin is warm and dry, not diaphoretic.   Psychiatric: normal mood and affect.       ASSESSMENT AND PLAN  Roy Landry is a 64 year old male who presents with:         Problem List Items Addressed This Visit        Endocrine and Metabolic    Prediabetes     Recheck A1c reminded to exercise          Relevant Orders    HEMOGLOBIN A1C (Completed)       Gastrointestinal and Abdominal    Elevated LFTs      switch to rosuvastatin 20mg  and recheck LFTs in 6wks   If still elevated will do cards econsult for next steps  Neuro    History of stroke    Relevant Orders    LIPID PANEL (Completed)      Other Visit Diagnoses     Transaminitis        Relevant Orders    COMPREHENSIVE METABOLIC PANEL (Completed)    Need for prophylactic vaccination and inoculation against influenza        Relevant Orders    IMMUNIZATION ADMIN SINGLE (Completed)        Patient to return to clinic for flu shot     I spent a total of 35 minutes on this visit on the date of service (total time includes all activities performed on the date of service)        ____________    Demetrius Revel, MD   Office phone: [phone]  [place]

## 2020-05-21 NOTE — Progress Notes (Signed)
Patient left before getting flu shot. Called patient and scheduled him for next week.  Randel Pigg, LPN, 66/11/3014

## 2020-05-22 LAB — HEMOGLOBIN A1C
ESTIMATED AVERAGE GLUCOSE: 137 mg/dL (ref 74–160)
HEMOGLOBIN A1C: 6.4 % — ABNORMAL HIGH (ref 4.0–5.6)

## 2020-05-22 MED ORDER — ROSUVASTATIN CALCIUM 20 MG PO TABS
20.0000 mg | ORAL_TABLET | Freq: Every day | ORAL | 3 refills | Status: DC
Start: 2020-05-22 — End: 2020-08-19

## 2020-05-22 NOTE — Assessment & Plan Note (Signed)
Recheck A1c reminded to exercise

## 2020-05-22 NOTE — Assessment & Plan Note (Addendum)
switch to rosuvastatin 20mg  and recheck LFTs in 6wks   If still elevated will do cards econsult for next steps

## 2020-05-23 ENCOUNTER — Telehealth (HOSPITAL_BASED_OUTPATIENT_CLINIC_OR_DEPARTMENT_OTHER): Payer: Self-pay

## 2020-05-23 NOTE — Telephone Encounter (Signed)
Appointment with nurse for 05/28/20 cancelled per Dr. Phylliss Blakes

## 2020-05-23 NOTE — Telephone Encounter (Signed)
-----   Message from [place]. Tommie Raymond, MD sent at 05/22/2020 12:03 PM EST -----  Can you please cancel [name]'s 12/6 appt. He will be rescheduled in 6wks after his lab visit (sent a different message) he does not need to be called

## 2020-05-24 ENCOUNTER — Telehealth (HOSPITAL_BASED_OUTPATIENT_CLINIC_OR_DEPARTMENT_OTHER): Payer: Self-pay

## 2020-05-24 ENCOUNTER — Telehealth (HOSPITAL_BASED_OUTPATIENT_CLINIC_OR_DEPARTMENT_OTHER): Payer: Self-pay | Admitting: Family Medicine

## 2020-05-24 ENCOUNTER — Encounter (HOSPITAL_BASED_OUTPATIENT_CLINIC_OR_DEPARTMENT_OTHER): Payer: Self-pay | Admitting: Family Medicine

## 2020-05-24 DIAGNOSIS — R7989 Other specified abnormal findings of blood chemistry: Secondary | ICD-10-CM

## 2020-05-24 NOTE — Telephone Encounter (Signed)
-----   Message from Hettie Holstein sent at 05/24/2020 11:39 AM EST -----  Regarding: RE: Lab appt w/ f/u televisit  Can you please put the order for this pt.  Thank you!    Marie  ----- Message -----  From: Darrol Poke. Tommie Raymond, MD  Sent: 05/22/2020  11:52 AM EST  To: [facility] Lab Orders Pool  Subject: Lab appt w/ f/u televisit                        Please schedule for lab and/or vitals visit as detailed below.      Labs ordered: Yes  Non-urgent vitals needed: Pulse, Blood pressure  Specific timeframe, otherwise considered non-urgent:6wks - televisit 2-4 days after with Tommie Raymond   Chart to be cc'd to the following provider for vitals review: Tommie Raymond     Thanks!

## 2020-05-24 NOTE — Telephone Encounter (Signed)
-----   Message from [place]. Tommie Raymond, MD sent at 05/22/2020 11:52 AM EST -----  Regarding: Lab appt w/ f/u televisit  Please schedule for lab and/or vitals visit as detailed below.      Labs ordered: Yes  Non-urgent vitals needed: Pulse, Blood pressure  Specific timeframe, otherwise considered non-urgent:6wks - televisit 2-4 days after with Tommie Raymond   Chart to be cc'd to the following provider for vitals review: Tommie Raymond     Thanks!

## 2020-05-24 NOTE — Telephone Encounter (Signed)
Called pt let vm for a call back.

## 2020-05-28 ENCOUNTER — Other Ambulatory Visit: Payer: Self-pay

## 2020-05-28 ENCOUNTER — Ambulatory Visit (HOSPITAL_BASED_OUTPATIENT_CLINIC_OR_DEPARTMENT_OTHER): Payer: Self-pay

## 2020-05-28 ENCOUNTER — Ambulatory Visit: Payer: Commercial Managed Care - HMO | Attending: Family Medicine | Admitting: Licensed Practical Nurse

## 2020-05-28 ENCOUNTER — Ambulatory Visit (HOSPITAL_BASED_OUTPATIENT_CLINIC_OR_DEPARTMENT_OTHER): Payer: Commercial Managed Care - HMO | Admitting: Family Medicine

## 2020-05-28 DIAGNOSIS — Z23 Encounter for immunization: Secondary | ICD-10-CM | POA: Insufficient documentation

## 2020-05-28 NOTE — Progress Notes (Signed)
Influenza Vaccine Procedure  May 28, 2020    1. Has the patient received the information for the influenza vaccine? Yes    2. Does the patient have any of the following contraindications?  Allergy to eggs? No  Allergic reaction to previous influenza vaccines? No  Any other problems to previous influenza vaccines? No  Paralyzed by Guillain-Barre syndrome?  No  Current moderate or severe illness? No  Allergy to contact lens solution? No    3. The vaccine has been administered in the usual fashion.     Immunization information reviewed. Current VIS reviewed and given to patient/ guardian. Verbal assent obtained from patient/ guardian.  See immunization/Injection module or chart review for date of publication and additional information. Verbal assent obtained from patient/guardian. Comfort measures for possible side effects reviewed.

## 2020-06-11 ENCOUNTER — Ambulatory Visit (HOSPITAL_BASED_OUTPATIENT_CLINIC_OR_DEPARTMENT_OTHER): Payer: Commercial Managed Care - HMO | Admitting: Family Medicine

## 2020-07-16 ENCOUNTER — Encounter (HOSPITAL_BASED_OUTPATIENT_CLINIC_OR_DEPARTMENT_OTHER): Payer: Self-pay

## 2020-07-18 ENCOUNTER — Ambulatory Visit (HOSPITAL_BASED_OUTPATIENT_CLINIC_OR_DEPARTMENT_OTHER): Payer: Self-pay

## 2020-08-19 ENCOUNTER — Encounter (HOSPITAL_BASED_OUTPATIENT_CLINIC_OR_DEPARTMENT_OTHER): Payer: Self-pay | Admitting: Family Medicine

## 2020-08-19 NOTE — Telephone Encounter (Signed)
PER Pharmacy, Roy Landry is a 65 year old male has requested a refill of      -  rosuvastatin        Last Office Visit: 11.29.21 with pcp   Last Physical Exam: 5.13.21     There are no preventive care reminders to display for this patient.     Other Med Adult:  Most Recent BP Reading(s)  05/21/20 : (!) 140/76        Cholesterol (mg/dL)   Date Value   05/21/2020 143     LOW DENSITY LIPOPROTEIN DIRECT (mg/dL)   Date Value   05/21/2020 68     HIGH DENSITY LIPOPROTEIN (mg/dL)   Date Value   05/21/2020 55     TRIGLYCERIDES (mg/dL)   Date Value   05/21/2020 77         No results found for: TSHSC      No results found for: TSH    HEMOGLOBIN A1C (%)   Date Value   05/21/2020 6.4 (H)       No results found for: POCA1C      No results found for: INR    SODIUM (mmol/L)   Date Value   05/21/2020 139       POTASSIUM (mmol/L)   Date Value   05/21/2020 4.5           CREATININE (mg/dL)   Date Value   05/21/2020 0.7        Documented patient preferred pharmacies:    [id number] [place]  - [place]  Phone: [phone] Fax: [phone]

## 2020-08-20 MED ORDER — ROSUVASTATIN CALCIUM 20 MG PO TABS
20.0000 mg | ORAL_TABLET | Freq: Every day | ORAL | 3 refills | Status: DC
Start: 2020-08-20 — End: 2022-01-11

## 2020-10-11 ENCOUNTER — Ambulatory Visit: Payer: Commercial Managed Care - HMO | Attending: Family Medicine | Admitting: Family Medicine

## 2020-10-11 ENCOUNTER — Other Ambulatory Visit: Payer: Self-pay

## 2020-10-11 ENCOUNTER — Encounter (HOSPITAL_BASED_OUTPATIENT_CLINIC_OR_DEPARTMENT_OTHER): Payer: Self-pay | Admitting: Family Medicine

## 2020-10-11 VITALS — BP 136/78 | HR 82 | Temp 97.9°F

## 2020-10-11 DIAGNOSIS — R7989 Other specified abnormal findings of blood chemistry: Secondary | ICD-10-CM | POA: Diagnosis present

## 2020-10-11 DIAGNOSIS — R7401 Elevation of levels of liver transaminase levels: Secondary | ICD-10-CM | POA: Diagnosis present

## 2020-10-11 DIAGNOSIS — R7309 Other abnormal glucose: Secondary | ICD-10-CM

## 2020-10-11 DIAGNOSIS — R195 Other fecal abnormalities: Secondary | ICD-10-CM | POA: Diagnosis present

## 2020-10-11 DIAGNOSIS — R7303 Prediabetes: Secondary | ICD-10-CM

## 2020-10-11 DIAGNOSIS — Z23 Encounter for immunization: Secondary | ICD-10-CM

## 2020-10-11 LAB — HEPATIC FUNCTION PANEL
ALANINE AMINOTRANSFERASE: 56 U/L — ABNORMAL HIGH (ref 12–45)
ALBUMIN: 4 g/dL (ref 3.4–5.0)
ALKALINE PHOSPHATASE: 74 U/L (ref 45–117)
ASPARTATE AMINOTRANSFERASE: 33 U/L (ref 8–34)
BILIRUBIN DIRECT: 0.2 mg/dl (ref 0.0–0.2)
BILIRUBIN TOTAL: 0.5 mg/dL (ref 0.2–1.0)
INDIRECT BILIRUBIN: 0.3 mg/dL (ref 0.2–0.9)
TOTAL PROTEIN: 7.7 g/dL (ref 6.4–8.2)

## 2020-10-11 LAB — LIPID PANEL
Cholesterol: 129 mg/dL (ref 0–239)
HIGH DENSITY LIPOPROTEIN: 52 mg/dL (ref 40–?)
LOW DENSITY LIPOPROTEIN DIRECT: 60 mg/dL (ref 0–189)
TRIGLYCERIDES: 69 mg/dL (ref 0–150)

## 2020-10-11 NOTE — Progress Notes (Signed)
10/11/2020  VIS given prior to administration and reviewed with the patient and or legal guardian. Patient understands the disease and the vaccine. See immunization/Injection module or chart review for date of publication and additional information.  Donald Prose, LPN  [id number] Vaccine Administration:    Patient screened according to guidelines.Vaccine Information Fact Sheet given prior to administration and all patient/parentquestions addressed. Covid-19 vaccine given without incident. Possible side effects reviewed. Returnto clinicfor nextscheduled dose if indicated.    Donald Prose, LPN

## 2020-10-11 NOTE — Progress Notes (Signed)
PRIMARY CARE NOTE - ESTABLISHED PATIENT      SUBJECTIVE  Roy Landry is a 65 year old English-speaking male here for:    Problem List        Endocrine and Metabolic    Prediabetes     10/11/2020: recheck today             Gastrointestinal and Abdominal    Decreased stool caliber     10/11/2020: comes and goes, reports normal colonoscopy   11/03/2019: having thinner caliber stools, straining to get them out, no weight loss, no fevers, no night sweat, within the last month, no urinary sx aside from rare hesitancy         Elevated LFTs     10/11/2020: will recheck today as has been on 20mg  rosuvastatin   05/22/2020: LFTs elevated since May 2021, no prior labs, neg testing for for hemochromatosis, Hep B and C, only one beer nightly, likely related to atorvastatin use but hesistant to lower given hx of CVA, will instead switch to rosuvastatin 20mg  and recheck LFTs in 6wks                      Active Medical Issues:  Patient Active Problem List:     History of stroke     Elevated blood pressure reading     History of colonic polyps     Urinary hesitancy     Prediabetes     Decreased stool caliber     Elevated LFTs        OBJECTIVE  BP 136/78   Pulse 82   Temp 97.9 F (36.6 C)   SpO2 97%   Most Recent Weight Reading(s)  05/21/20 : 83.9 kg (185 lb)  12/23/19 : 83.9 kg (185 lb)  12/14/19 : 85.1 kg (187 lb 9.6 oz)  11/03/19 : 86.6 kg (191 lb)  08/20/18 : 86.1 kg (189 lb 14.4 oz)      Most Recent BP Reading(s)  10/11/20 : 136/78  05/21/20 : (!) 140/76  12/23/19 : 127/70  12/14/19 : 133/67  11/03/19 : 136/84        ASSESSMENT AND PLAN  Roy Landry is a 65 year old male who presents with:         Problem List Items Addressed This Visit        Endocrine and Metabolic    Prediabetes       Gastrointestinal and Abdominal    Decreased stool caliber    Elevated LFTs      Other Visit Diagnoses     Transaminitis        Relevant Orders    HEPATIC FUNCTION PANEL    LIPID PANEL    Elevated hemoglobin A1c        Relevant Orders     HEMOGLOBIN A1C    COVID-19 vaccine administered        Relevant Orders    IMMUNIZATION ADMIN SINGLE (Completed)    PFIZER COVID-19 VACCINE (GRAY CAP) (Completed)          ____________    Demetrius Revel, MD   Office phone: [phone]  [facility]

## 2020-10-12 ENCOUNTER — Telehealth (HOSPITAL_BASED_OUTPATIENT_CLINIC_OR_DEPARTMENT_OTHER): Payer: Self-pay | Admitting: Family Medicine

## 2020-10-12 LAB — HEMOGLOBIN A1C
ESTIMATED AVERAGE GLUCOSE: 131 mg/dL (ref 74–160)
HEMOGLOBIN A1C: 6.2 % — ABNORMAL HIGH (ref 4.0–5.6)

## 2020-10-12 NOTE — Telephone Encounter (Signed)
Phoned patient with lab results, overall labs improved, still pre-diabetic   Lipids similar level on crestor   transaminitis at similar level     Pt wants to try and incorporate more movement into his life. After another recheck in 49months would be reasonable to consider decrease in crestor to 10mg  which may improve his still slightly elevated ALT.   Pt stated he really wants to decrease his dose as much as possible during the visit yesterday but need to be wary as did have a stroke in 2020.     No answer, left message with above info       Most Recent BP Reading(s)  10/11/20 : 136/78  05/21/20 : (!) 140/76  12/23/19 : 127/70

## 2020-11-20 ENCOUNTER — Ambulatory Visit (HOSPITAL_BASED_OUTPATIENT_CLINIC_OR_DEPARTMENT_OTHER): Payer: Self-pay

## 2020-12-26 ENCOUNTER — Other Ambulatory Visit (HOSPITAL_BASED_OUTPATIENT_CLINIC_OR_DEPARTMENT_OTHER): Payer: Self-pay | Admitting: Physician Assistant

## 2020-12-26 NOTE — Telephone Encounter (Signed)
PER Pharmacy, Roy Landry is a 65 year old male has requested a refill of aspirin .      Last Office Visit: 10/11/20  with Tommie Raymond, j  Last Physical Exam: 11/03/19    There are no preventive care reminders to display for this patient.    Other Med Adult:  Most Recent BP Reading(s)  10/11/20 : 136/78        Cholesterol (mg/dL)   Date Value   10/11/2020 129     LOW DENSITY LIPOPROTEIN DIRECT (mg/dL)   Date Value   10/11/2020 60     HIGH DENSITY LIPOPROTEIN (mg/dL)   Date Value   10/11/2020 52     TRIGLYCERIDES (mg/dL)   Date Value   10/11/2020 69         No results found for: TSHSC      No results found for: TSH    HEMOGLOBIN A1C (%)   Date Value   10/11/2020 6.2 (H)       No results found for: POCA1C      No results found for: INR    SODIUM (mmol/L)   Date Value   05/21/2020 139       POTASSIUM (mmol/L)   Date Value   05/21/2020 4.5           CREATININE (mg/dL)   Date Value   05/21/2020 0.7       Documented patient preferred pharmacies:    [id number] [place], [place]  Phone: [phone] Fax: [phone]

## 2021-02-27 ENCOUNTER — Encounter (HOSPITAL_BASED_OUTPATIENT_CLINIC_OR_DEPARTMENT_OTHER): Payer: Self-pay | Admitting: Family Medicine

## 2021-02-27 ENCOUNTER — Other Ambulatory Visit: Payer: Self-pay

## 2021-02-27 ENCOUNTER — Ambulatory Visit: Payer: Commercial Managed Care - HMO | Attending: Family Medicine | Admitting: Family Medicine

## 2021-02-27 VITALS — BP 154/90 | HR 74 | Temp 97.2°F | Wt 192.0 lb

## 2021-02-27 DIAGNOSIS — R030 Elevated blood-pressure reading, without diagnosis of hypertension: Secondary | ICD-10-CM | POA: Insufficient documentation

## 2021-02-27 DIAGNOSIS — Z8673 Personal history of transient ischemic attack (TIA), and cerebral infarction without residual deficits: Secondary | ICD-10-CM | POA: Insufficient documentation

## 2021-02-27 LAB — LIPID PANEL
Cholesterol: 120 mg/dL (ref 0–239)
HIGH DENSITY LIPOPROTEIN: 46 mg/dL (ref 40–60)
LOW DENSITY LIPOPROTEIN DIRECT: 63 mg/dL (ref 0–189)
TRIGLYCERIDES: 91 mg/dL (ref 0–150)

## 2021-02-27 MED ORDER — BLOOD PRESSURE CUFF MISC
1.0000 | Freq: Every day | 0 refills | Status: AC
Start: 2021-02-27 — End: 2021-03-29

## 2021-02-27 NOTE — Progress Notes (Signed)
PRIMARY CARE NOTE - ESTABLISHED PATIENT      SUBJECTIVE  Roy Landry is a 65 year old English-speaking male here for:    Follow Up    (obtained by [place] and reviewed)    OBJECTIVE  BP (!) 160/90   Pulse 74   Temp 97.2 F (36.2 C) (Temporal)   Wt 87.1 kg (192 lb)   SpO2 97%   BMI 28.44 kg/m             Problem List        Cardiac and Vasculature    Elevated blood pressure reading     02/27/2021: BP elevated to 160/90 on multiple checks, lowest with manual 154/90. Generally does not like taking medicaiton unless necessary, Will get home BP cuff and take 10 times over the next two weeks then bring in cuff to be calibrated. If continues to be elevated will likely need to start med.   11/11/2019: Not checking BP at home, does not have a cuff. Planning to go to the gym starting on Monday. Working on [employer] and [place]. Denies TIAs, vision changes, chest pain, DOE, edema.    11/03/2019: BP high normal on arrival 130s/70s, on later recheck by provider 150s/80s, has not yet returned to the gym   10/07/2019: does physical work but no additional exercise given his gym has been closed, planning to restart that, no BP cuff at home  08/20/2018: 146/84 on recheck, concern given recent hx of stroke, pt does not want medications, gave information on the DASH diet and exercise for now              Neuro    History of stroke     02/27/2021: rechecking lipids today and working on BP. If LDL still below goal of 70 would be reasonable to further lower dose per pt request   10/07/2019: was discharged from neurology as told that everything stabilized   09/14/2018: has neurology appoitment on 4/24 at [facility]   08/20/2018: Jan 2020, started aspirin and statin, was told needed f/u EEG, stopped smoking in 2018, referral placed to Dr. Caryn Section at [facility] per pt request                     Problem List Items Addressed This Visit        Cardiac and Vasculature    Elevated blood pressure reading       Neuro    History of stroke     Relevant Orders    LIPID PANEL            ______    Demetrius Revel, MD   Office phone: [phone]  [facility]

## 2021-02-28 ENCOUNTER — Other Ambulatory Visit (HOSPITAL_BASED_OUTPATIENT_CLINIC_OR_DEPARTMENT_OTHER): Payer: Self-pay

## 2021-02-28 DIAGNOSIS — Z789 Other specified health status: Secondary | ICD-10-CM

## 2021-03-14 ENCOUNTER — Ambulatory Visit (HOSPITAL_BASED_OUTPATIENT_CLINIC_OR_DEPARTMENT_OTHER): Payer: Self-pay | Admitting: Family Medicine

## 2021-12-21 ENCOUNTER — Other Ambulatory Visit (HOSPITAL_BASED_OUTPATIENT_CLINIC_OR_DEPARTMENT_OTHER): Payer: Self-pay | Admitting: Student in an Organized Health Care Education/Training Program

## 2021-12-21 NOTE — Telephone Encounter (Signed)
PER Pharmacy, Roy Landry is a 66 year old male has requested a refill of aspirin.      Last Office Visit: 02/27/2021 with PCP  Last Physical Exam: 11/03/2019    There are no preventive care reminders to display for this patient.    Other Med Adult:  Most Recent BP Reading(s)  02/27/21 : (!) 154/90        Cholesterol (mg/dL)   Date Value   02/27/2021 120     LOW DENSITY LIPOPROTEIN DIRECT (mg/dL)   Date Value   02/27/2021 63     HIGH DENSITY LIPOPROTEIN (mg/dL)   Date Value   02/27/2021 46     TRIGLYCERIDES (mg/dL)   Date Value   02/27/2021 91         No results found for: TSHSC      No results found for: TSH    HEMOGLOBIN A1C (%)   Date Value   10/11/2020 6.2 (H)       No results found for: POCA1C      No results found for: INR    SODIUM (mmol/L)   Date Value   05/21/2020 139       POTASSIUM (mmol/L)   Date Value   05/21/2020 4.5           CREATININE (mg/dL)   Date Value   05/21/2020 0.7       Documented patient preferred pharmacies:    [id number] [place] [place] - [address] Phone: [phone]: [phone]

## 2022-01-11 ENCOUNTER — Encounter (HOSPITAL_BASED_OUTPATIENT_CLINIC_OR_DEPARTMENT_OTHER): Payer: Self-pay | Admitting: Physician Assistant

## 2022-01-13 MED ORDER — ROSUVASTATIN CALCIUM 20 MG PO TABS
20.00 mg | ORAL_TABLET | Freq: Every day | ORAL | 3 refills | Status: AC
Start: 2022-01-13 — End: 2023-01-08

## 2022-01-13 NOTE — Telephone Encounter (Signed)
This prescription refill request has passed the Rx Renewal Authorization protocol.  This medication has been approved and sent to the patient's preferred pharmacy.      PER Patient (self), Roy Landry is a 66 year old male has requested a refill of rosuvastatin.      Last Office Visit: 02/27/2021 with PCP  Last Physical Exam: 11/03/2019    There are no preventive care reminders to display for this patient.    Other Med Adult:  Most Recent BP Reading(s)  02/27/21 : (!) 154/90        Cholesterol (mg/dL)   Date Value   02/27/2021 120     LOW DENSITY LIPOPROTEIN DIRECT (mg/dL)   Date Value   02/27/2021 63     HIGH DENSITY LIPOPROTEIN (mg/dL)   Date Value   02/27/2021 46     TRIGLYCERIDES (mg/dL)   Date Value   02/27/2021 91         No results found for: TSHSC      No results found for: TSH    HEMOGLOBIN A1C (%)   Date Value   10/11/2020 6.2 (H)       No results found for: POCA1C      No results found for: INR    SODIUM (mmol/L)   Date Value   05/21/2020 139       POTASSIUM (mmol/L)   Date Value   05/21/2020 4.5           CREATININE (mg/dL)   Date Value   05/21/2020 0.7       Documented patient preferred pharmacies:    [id number] [place], [place] - [address] Phone: [phone]: [phone]

## 2023-12-26 IMAGING — CT TC CRANIO
1 of 2 series · 13 of 30 positions shown, 17 images · non-contrast
Comparison: none

[Series 3: cranio s/c · axial · 0.49mm/px · z∈[-29,+116]mm · 13 of 155 slices shown, 17 images]
[im 12/155  brain]
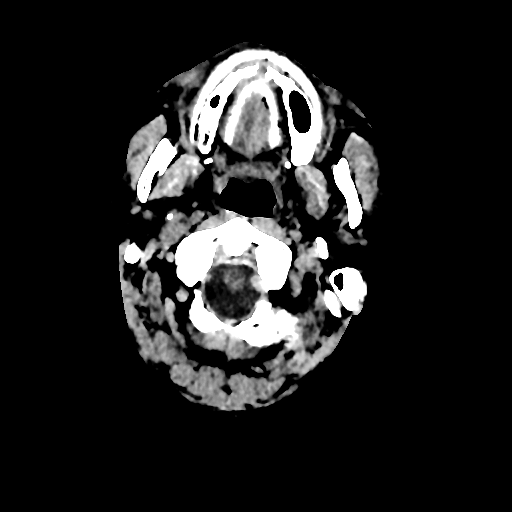
[im 12/155  bone]
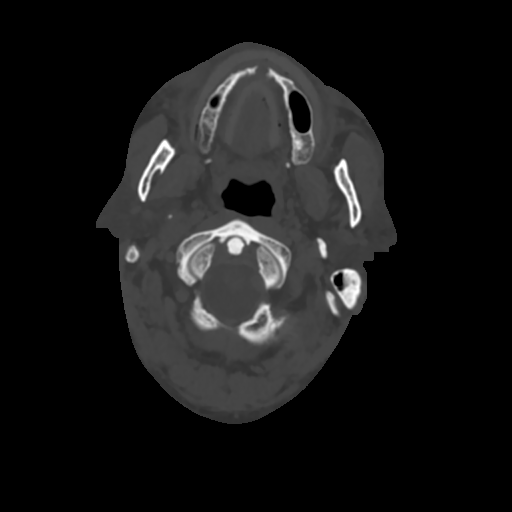
[im 23/155  brain]
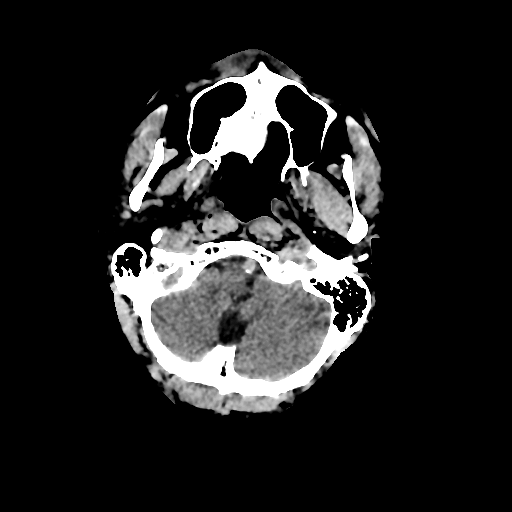
[im 34/155  brain]
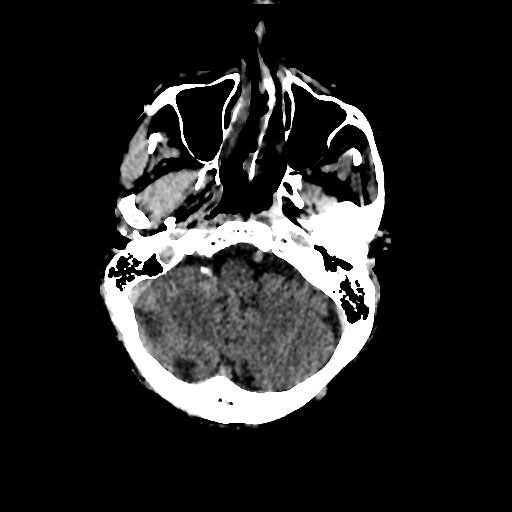
[im 45/155  brain]
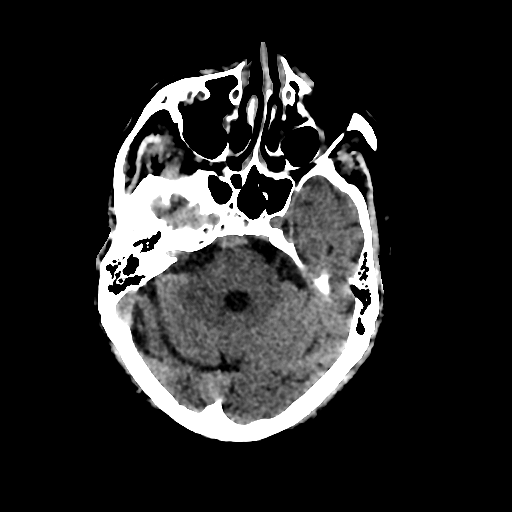
[im 56/155  brain]
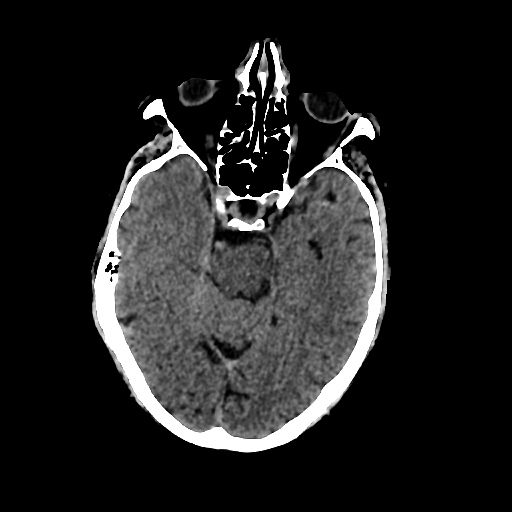
[im 56/155  bone]
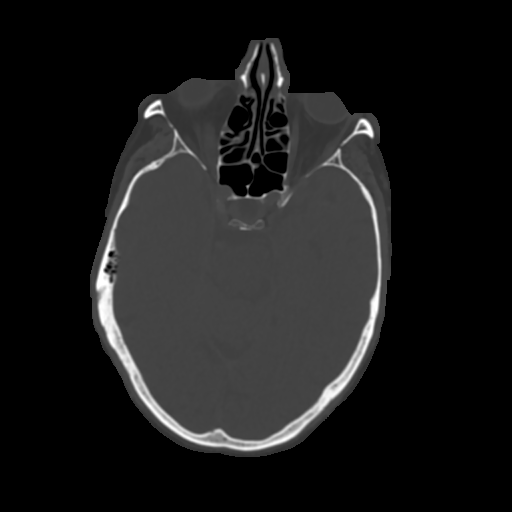
[im 67/155  brain]
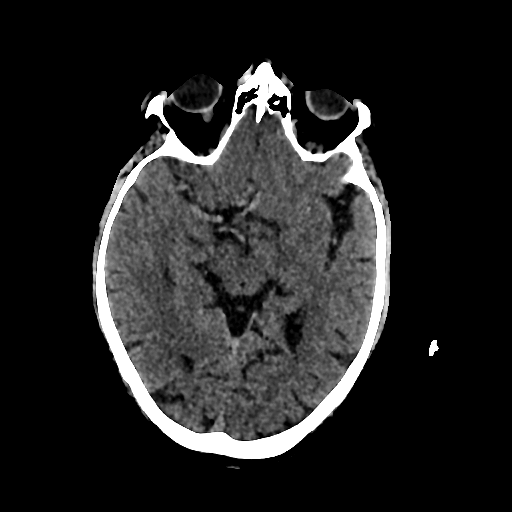
[im 78/155  brain]
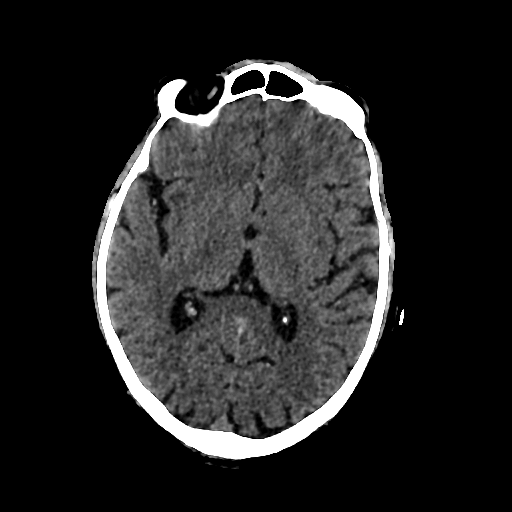
[im 89/155  brain]
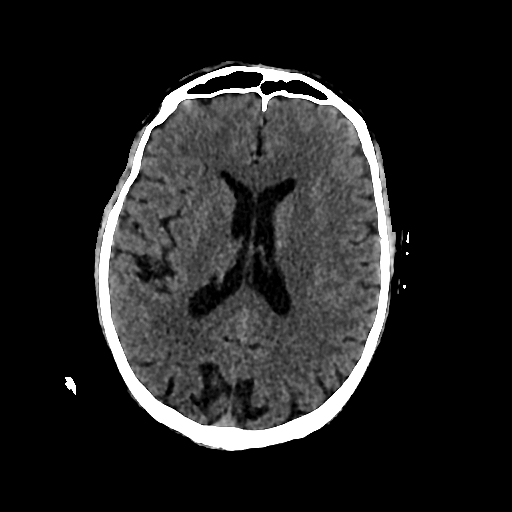
[im 100/155  brain]
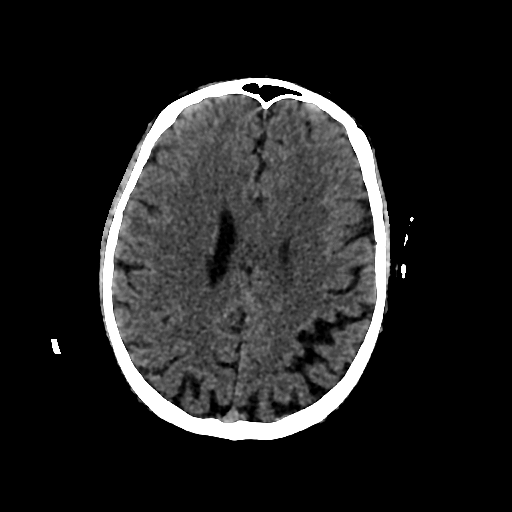
[im 100/155  bone]
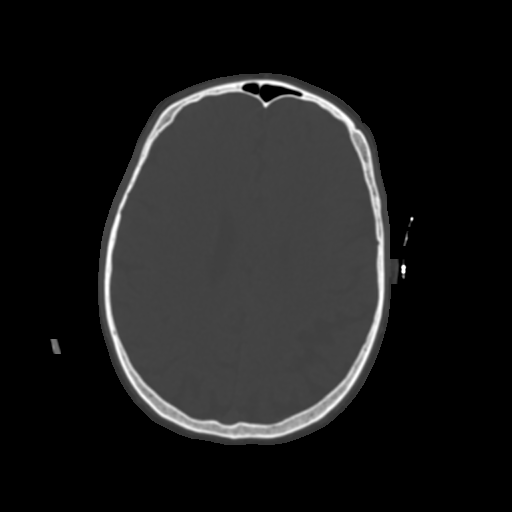
[im 111/155  brain]
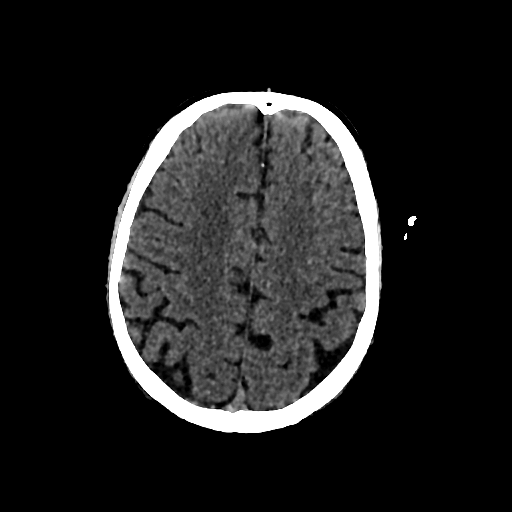
[im 122/155  brain]
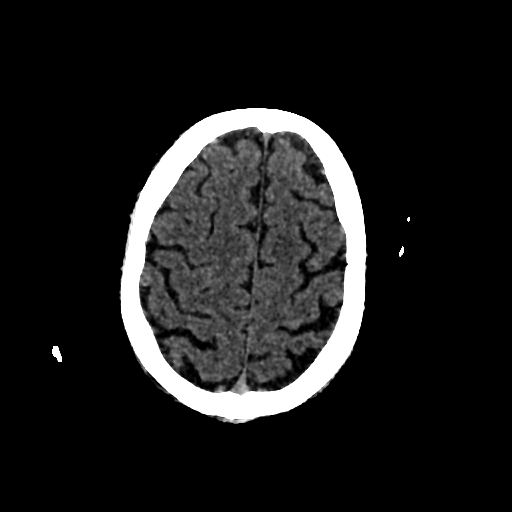
[im 133/155  brain]
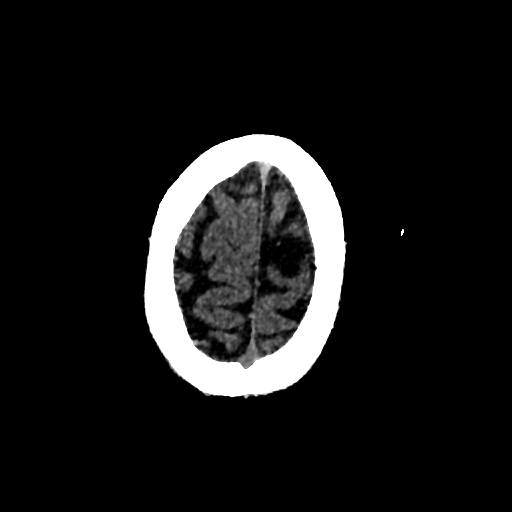
[im 144/155  brain]
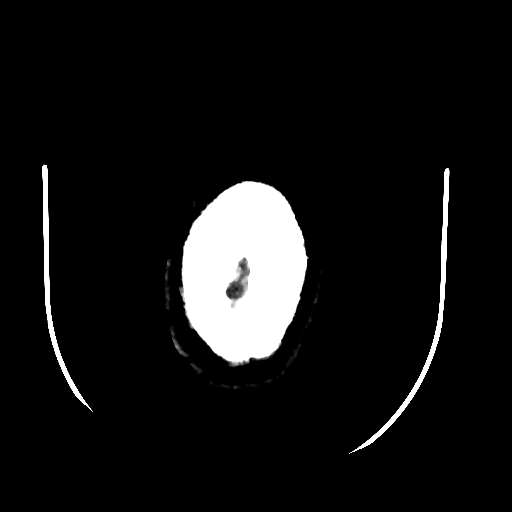
[im 144/155  bone]
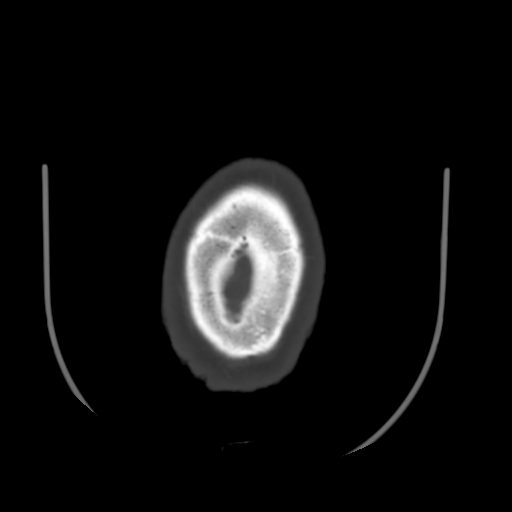

[13 of 30 positions shown; findings below may reference images not displayed]

Sexo:Masculino
Solicitante:Myriian Nangi (CRM [id number])
Técnica do exame:
Realizadas aquisições volumétricas em aparelho multislice, com posteriores reconstruções multiplanares. Imagens
obtidas sem o uso do meio de contraste endovenoso.
Relatório
Alargamento dos sulcos corticais, assim como aumento dos ventrículos laterais.
Hipodensidades  na  substância  branca  dos  hemisférios  cerebrais,  ao  redor  dos  cornos  anteriores  dos  ventrículos
laterais.
TOMOGRAFIA COMPUTADORIZADA DE CRÂNIO
Ausência de coleções hemáticas intracranianas ou desvio das estruturas da linha média.
Reusﬁcações  nas  paredes  dos  segmentos 
intracranianos  das  artérias  carótidas 
internas  e  no  sistema
vertebrobasilar.
Conclusão
Sinais de redução volumétrica do encéfalo e de ateromatose intracraniana.
Hipodensidades  na  substância  branca  dos  hemisférios  cerebrais,  inespecíﬁcas,  mais  frequentemente
relacionadas a rarefação de mielina/gliose.
[place]

## 2023-12-30 IMAGING — CT TC  ABDOME TOTAL
1 series · 15 of 32 positions shown, 19 images · non-contrast
Comparison: none

[Series 3: sem contraste · axial · 0.84mm/px · z∈[-482,+24]mm · 15 of 449 slices shown, 19 images]
[im 29/449  soft-tissue]
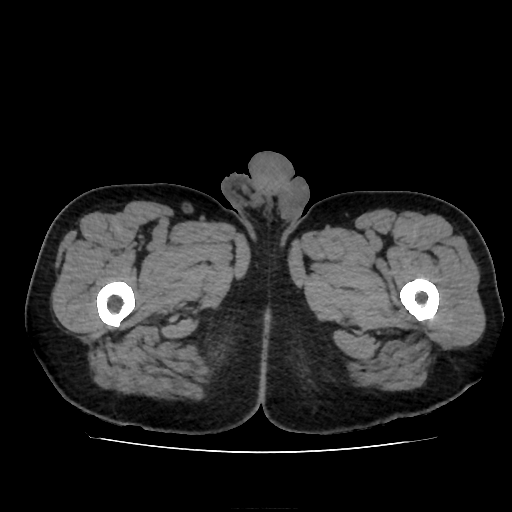
[im 29/449  bone]
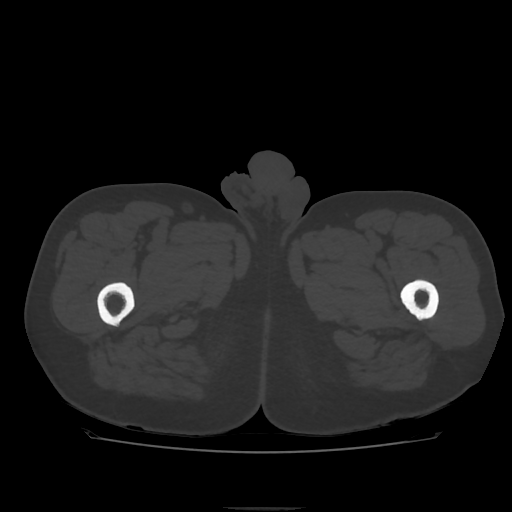
[im 58/449  soft-tissue]
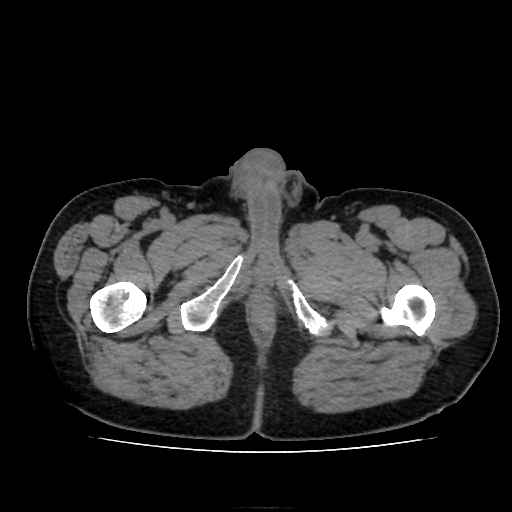
[im 87/449  soft-tissue]
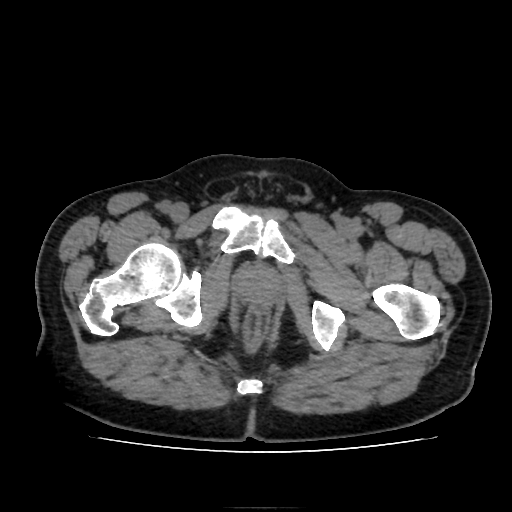
[im 131/449  soft-tissue]
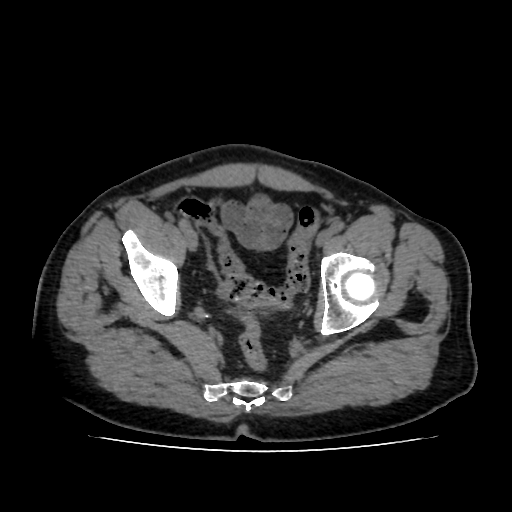
[im 159/449  soft-tissue]
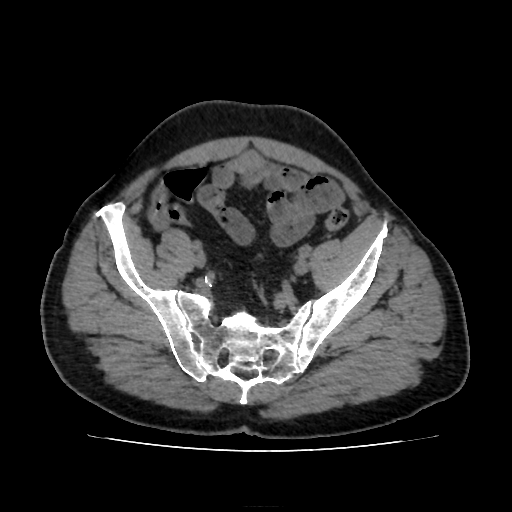
[im 188/449  soft-tissue]
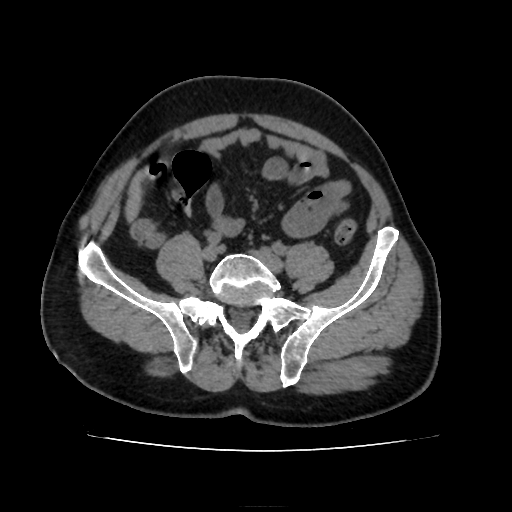
[im 232/449  soft-tissue]
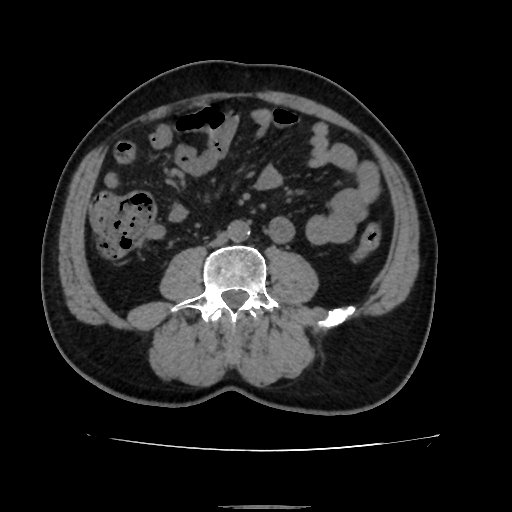
[im 261/449  soft-tissue]
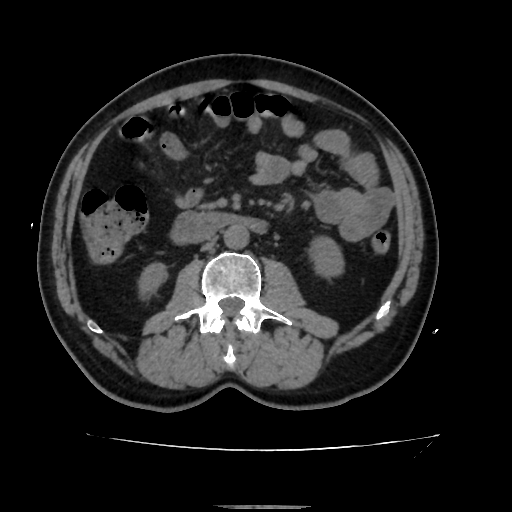
[im 290/449  soft-tissue]
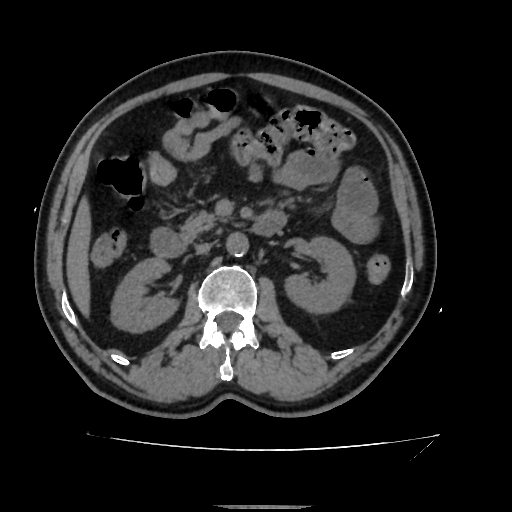
[im 290/449  bone]
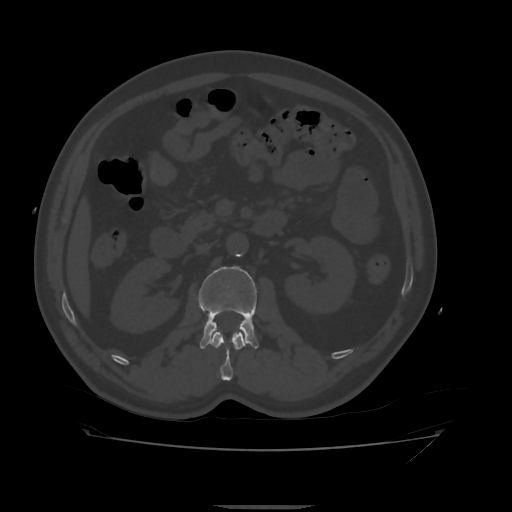
[im 318/449  soft-tissue]
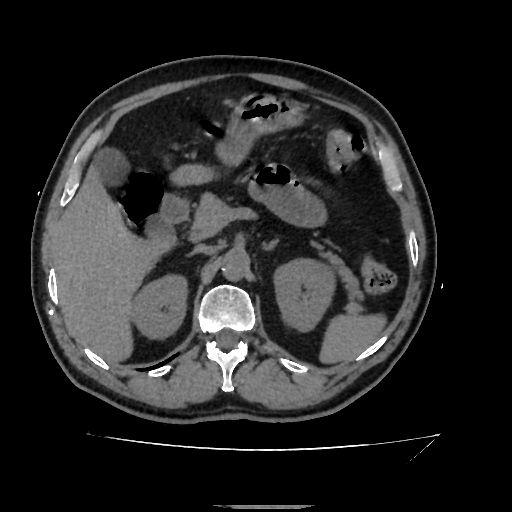
[im 362/449  soft-tissue]
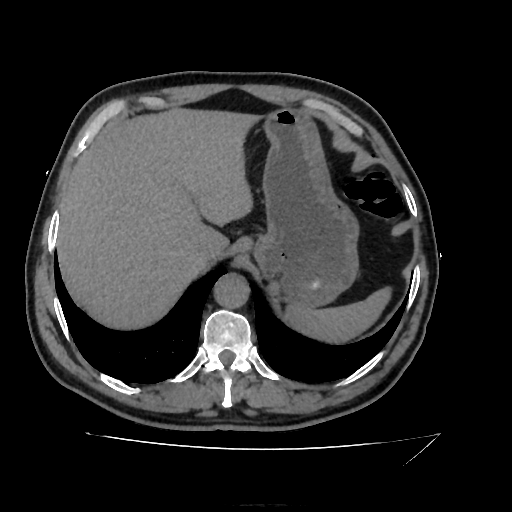
[im 391/449  soft-tissue]
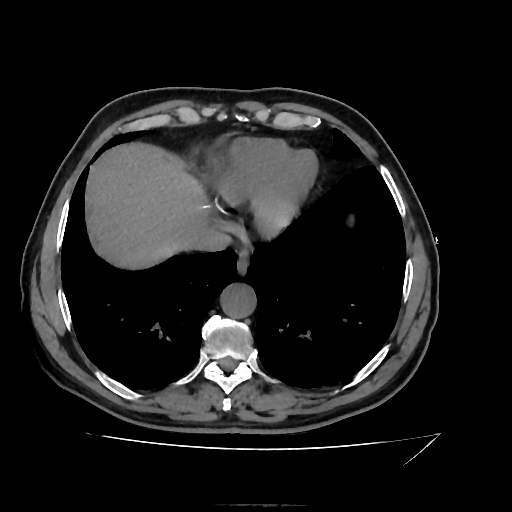
[im 391/449  lung]
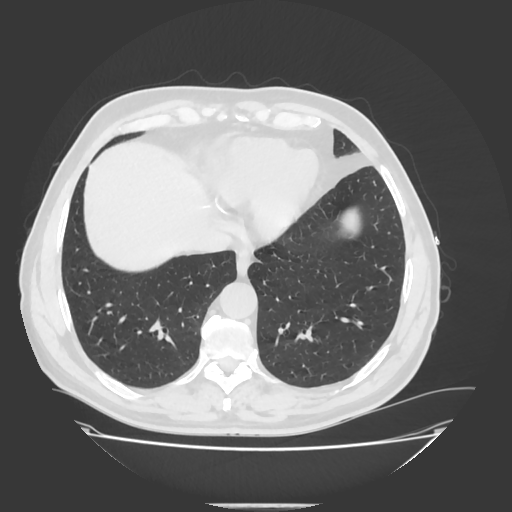
[im 405/449  lung]
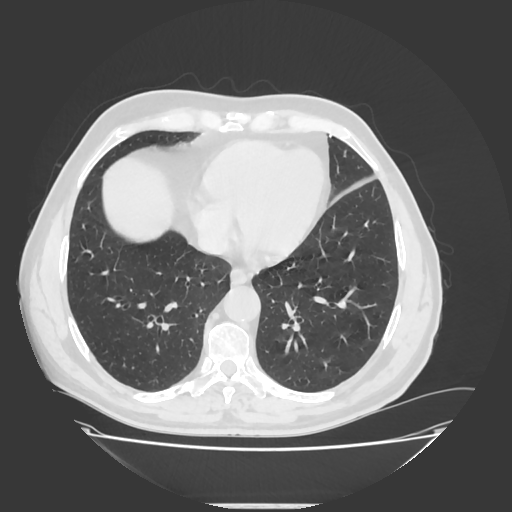
[im 420/449  soft-tissue]
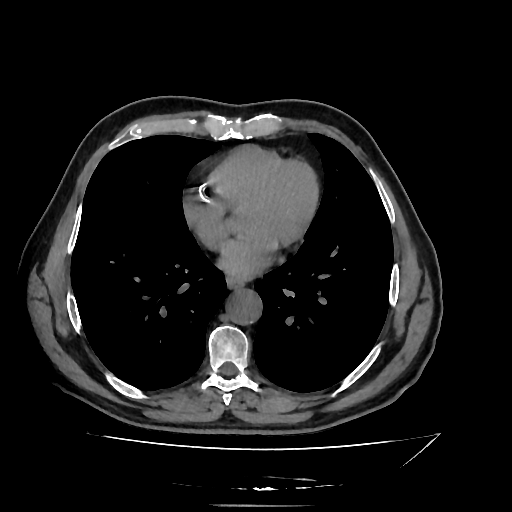
[im 420/449  lung]
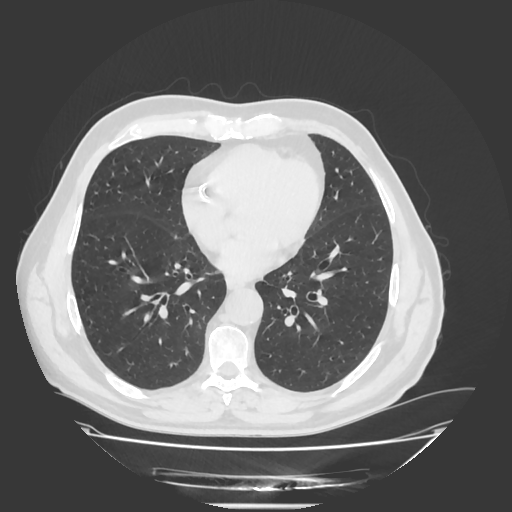
[im 434/449  lung]
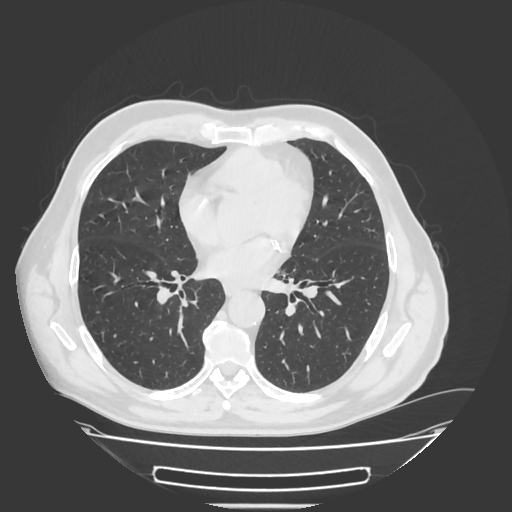

[15 of 32 positions shown; findings below may reference images not displayed]

Sexo:Masculino
Técnica:
Aquisições volumétricas com reconstruções multiplanares.
Relatório:
Fígado com dimensões, morfologia e densidade normais. Kjﬁcações ovaladas residuais, justapostas, localizadas
no lobo hepático esquerdo.
TOMOGRAFIA COMPUTADORIZADA DO ABDOME SUPERIOR E PELVE
Não há dilatação das vias biliares intra ou extra-hepáticas.
Vesícula biliar sem alterações detectáveis pelo método.
Pâncreas com morfologia e Nengsihﬁciente de atenuação preservados. Não há dilatação do ducto pancreático principal.
Baço com topograﬁa, dimensões e densidade normais. Adrenais com aspecto preservado.
Rins tópicos, com dimensões e contornos preservados. Não há cálculos ou hidronefrose.
Ateromatose Gangulaﬁcada na aorta abdominal, nas artérias ilíacas e em seus ramos viscerais.
Bexiga urinária com repleção adequada, apresentando discreto espessamento difuso de suas paredes.
Próstata com pequeno aumento de suas dimensões.
Flebólitos localizados na cavidade pélvica, adjacentes à bexiga urinária.
Alças intestinais sem alterações detectáveis pelo método.
Ausência de linfonodomegalias ou líquido livre na cavidade.
Impressão:
Kjﬁcações ovaladas residuais, justapostas, localizadas no lobo hepático esquerdo.
Ateromatose Gangulaﬁcada na aorta abdominal, nas artérias ilíacas e em seus ramos viscerais.
Discreto espessamento difuso das paredes da bexiga urinária.
[place] Hon[place]
Sexo:Masculino
Próstata com pequeno aumento de suas dimensões.
[place] Honore

## 2024-04-20 IMAGING — MR RM  CRANIO (ENCEFALO)
7 of 9 series · 26 of 48 positions shown · non-contrast
Comparison: none

[Series 3: T1 · sagittal · 5.0mm · 0.51mm/px · 4 of 20 slices shown]
[im 1/20]
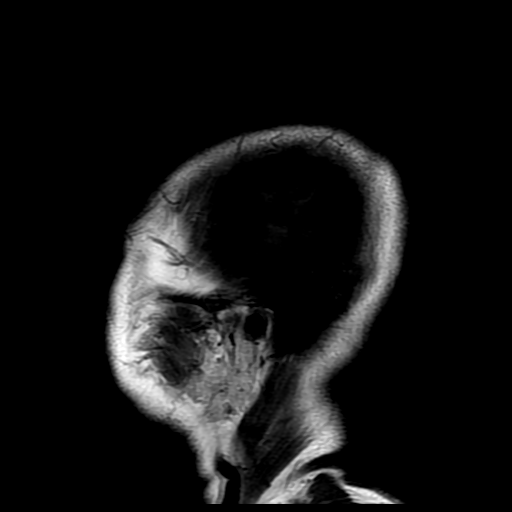
[im 7/20]
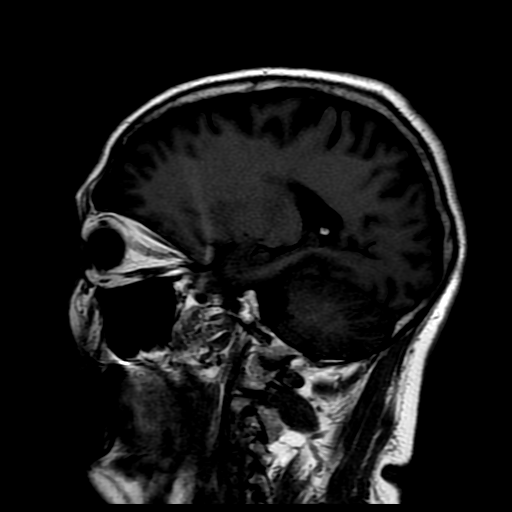
[im 13/20]
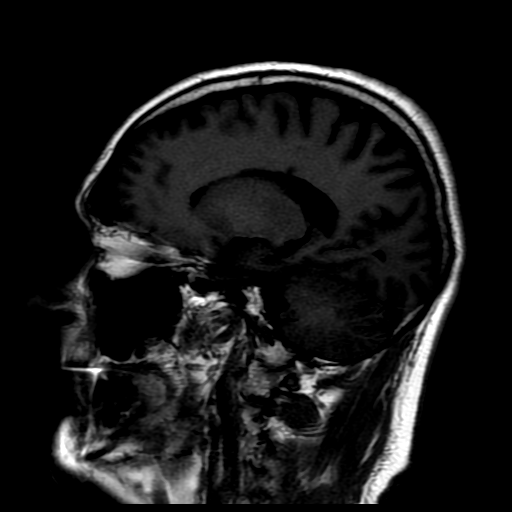
[im 20/20]
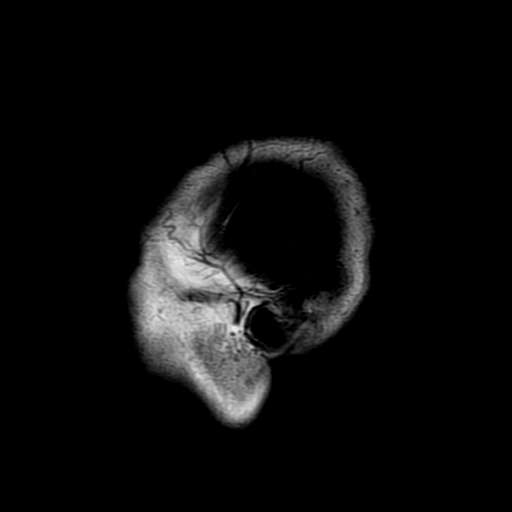

[Series 4: FLAIR · axial · 5.5mm · 0.51mm/px · z∈[-11,+141]mm · 4 of 24 slices shown]
[im 1/24]
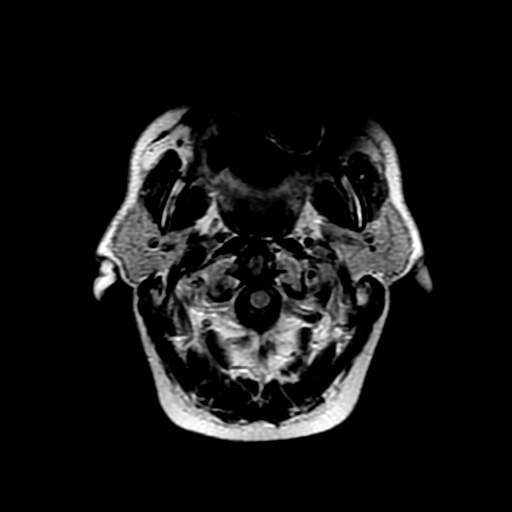
[im 8/24]
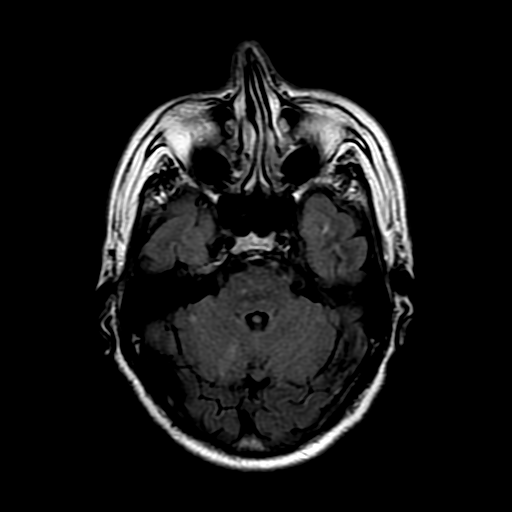
[im 16/24]
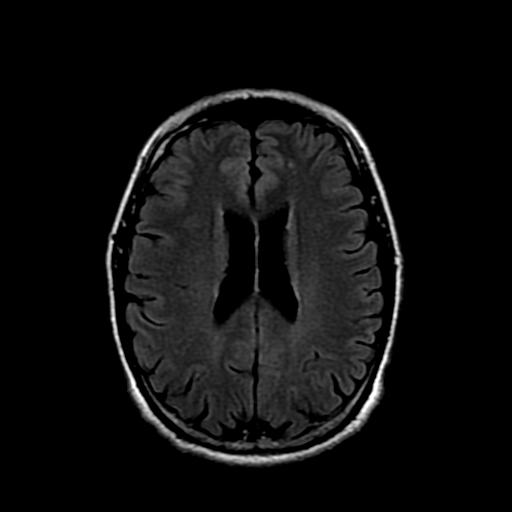
[im 24/24]
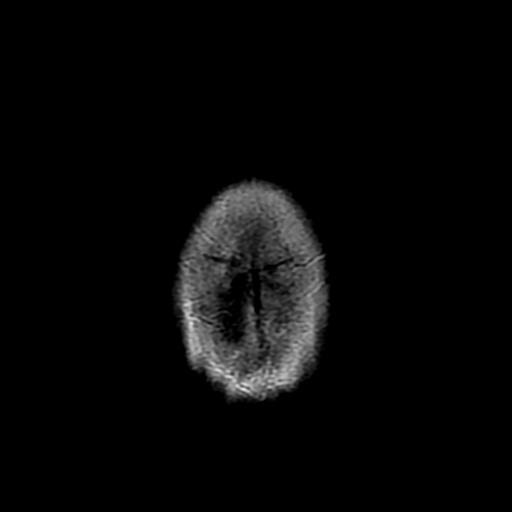

[Series 5: axi difusao (id) · axial · 5.5mm · 1.02mm/px · z∈[-12,+141]mm · 7 of 48 slices shown]
[im 1/48]
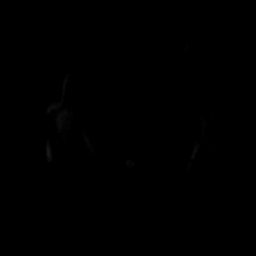
[im 8/48]
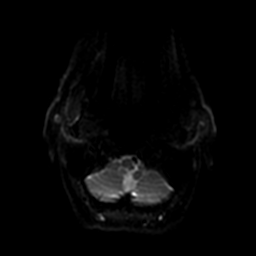
[im 16/48]
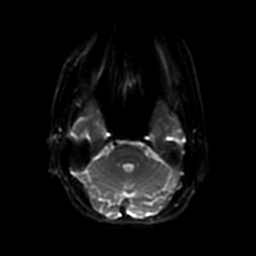
[im 24/48]
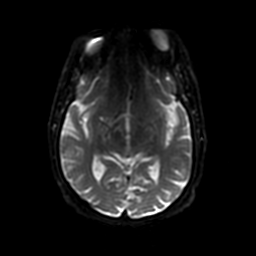
[im 32/48]
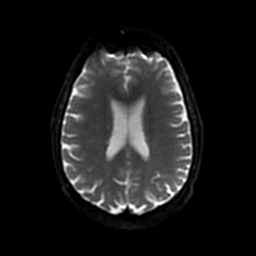
[im 40/48]
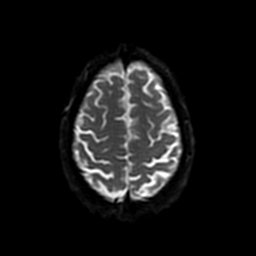
[im 48/48]
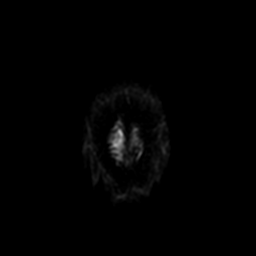

[Series 6: T2 · axial · 5.5mm · 0.51mm/px · z∈[-11,+141]mm · 3 of 24 slices shown]
[im 1/24]
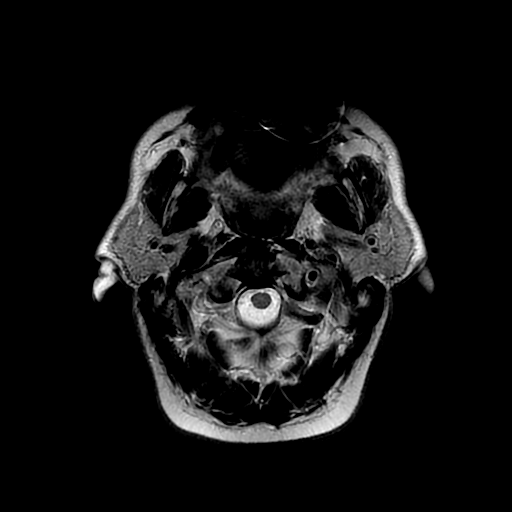
[im 12/24]
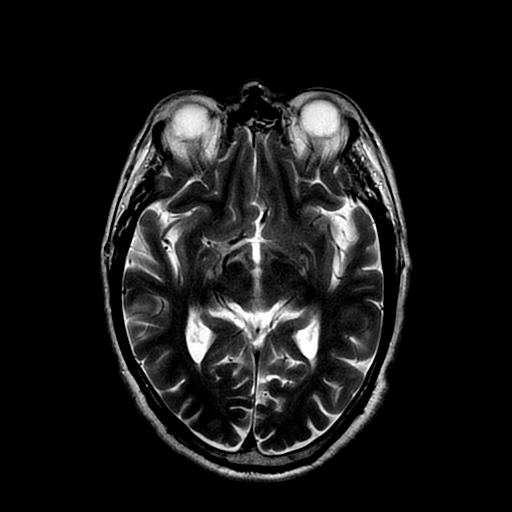
[im 24/24]
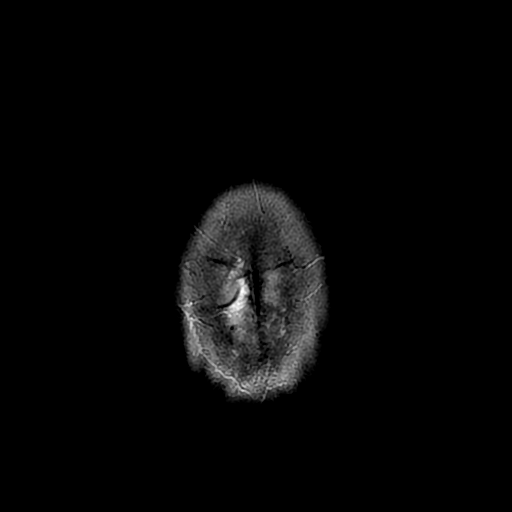

[Series 500: DWI · axial · 5.5mm · 1.02mm/px · z∈[-12,+141]mm · 3 of 24 slices shown (1 of 2)]
[im 1/24]
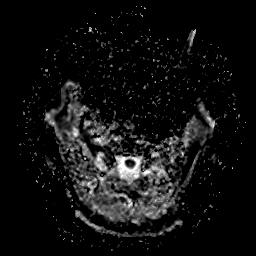
[im 12/24]
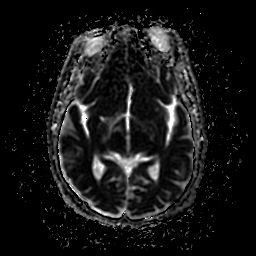
[im 24/24]
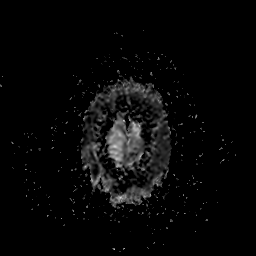

[Series 501: DWI · axial · 5.5mm · 1.02mm/px · z∈[-12,+141]mm · 3 of 24 slices shown (2 of 2)]
[im 1/24]
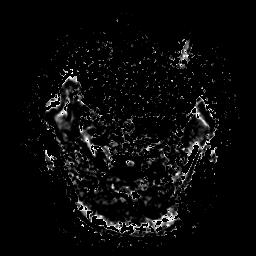
[im 12/24]
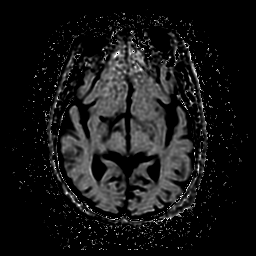
[im 24/24]
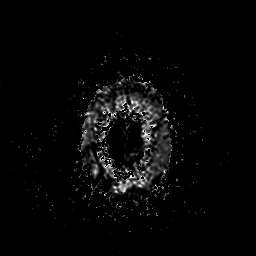

[Series 701: multiplanar reconstruction (mpr) · axial · 10.0mm · 0.47mm/px · z∈[-6,+142]mm · 2 of 16 slices shown]
[im 1/16]
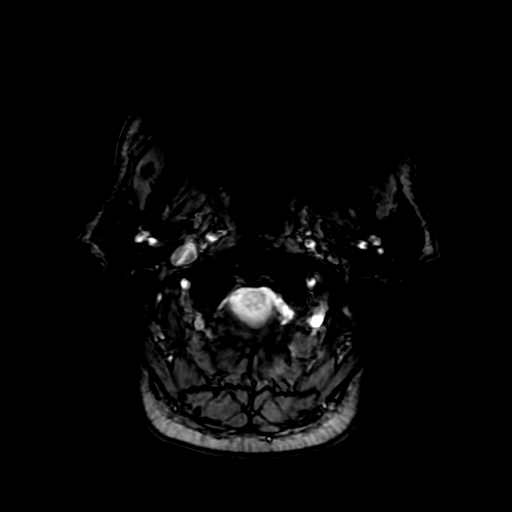
[im 16/16]
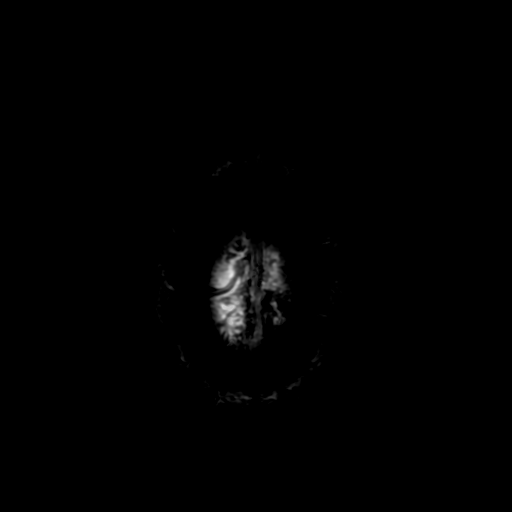

[26 of 48 positions shown; findings below may reference images not displayed]

Sexo:Masculino
Solicitante:Wadut Yomaah (CRM [id number])
METODOLOGIA:
Exame realizado com sequências SE (spin-echo), FSE (fast spin-eco), GR (gradiente-eco) e FSE-IR (FLAIR), em planos
de cortes múltiplos, sem a administração intravenosa do agente de contraste paramagnético.
ANÁLISE:
Redução volumétrica encefálica difusa com dilatação compensatória do espaço subaracnoideo.
Focos de hipersinal em T2 distribuídos pela substância branca supratentorial, sem determinar efeito atróﬁco ou
expansivo signiﬁcativo, sugerindo gliose por microangiopatia.
RESSONÂNCIA MAGNÉTICA DO ENCÉFALO
Ausência de hidrocefalia hipertensiva.
Tronco cerebral e cerebelo apresentando forma e intensidade de sinal normal.
Não há sinais de processo expansivo intracraniano.
Corpo caloso de morfologia e espessuras normais.
A sequência eco-planar não evidencia focos de restrição à difusão passiva de água.
IMPRESSÃO:
Redução volumétrica encefálica difusa.
Provável gliose por microangiopatia na substância branca supratentorial.
